# Patient Record
Sex: Male | Born: 1995 | Race: White | Hispanic: No | Marital: Single | Smoking: Never smoker
Health system: Southern US, Community
[De-identification: ages and names within clinical notes are randomized; demographics above are authoritative.]

## PROBLEM LIST (undated history)

## (undated) DIAGNOSIS — G250 Essential tremor: Secondary | ICD-10-CM

## (undated) DIAGNOSIS — D8989 Other specified disorders involving the immune mechanism, not elsewhere classified: Secondary | ICD-10-CM

## (undated) DIAGNOSIS — R011 Cardiac murmur, unspecified: Secondary | ICD-10-CM

## (undated) DIAGNOSIS — B948 Sequelae of other specified infectious and parasitic diseases: Secondary | ICD-10-CM

## (undated) DIAGNOSIS — J302 Other seasonal allergic rhinitis: Secondary | ICD-10-CM

## (undated) DIAGNOSIS — G43909 Migraine, unspecified, not intractable, without status migrainosus: Secondary | ICD-10-CM

## (undated) HISTORY — PX: ULNAR NERVE TRANSPOSITION: SHX2595

## (undated) HISTORY — DX: Other specified disorders involving the immune mechanism, not elsewhere classified: D89.89

## (undated) HISTORY — DX: Cardiac murmur, unspecified: R01.1

## (undated) HISTORY — DX: Sequelae of other specified infectious and parasitic diseases: B94.8

## (undated) HISTORY — DX: Migraine, unspecified, not intractable, without status migrainosus: G43.909

---

## 2018-09-16 ENCOUNTER — Encounter: Payer: Self-pay | Admitting: Emergency Medicine

## 2018-09-16 ENCOUNTER — Emergency Department: Payer: 59

## 2018-09-16 ENCOUNTER — Other Ambulatory Visit: Payer: Self-pay

## 2018-09-16 ENCOUNTER — Observation Stay
Admission: EM | Admit: 2018-09-16 | Discharge: 2018-09-17 | Disposition: A | Discharge status: Home / Self Care | Payer: 59 | Attending: Internal Medicine | Admitting: Internal Medicine

## 2018-09-16 DIAGNOSIS — E860 Dehydration: Secondary | ICD-10-CM | POA: Diagnosis present

## 2018-09-16 DIAGNOSIS — N433 Hydrocele, unspecified: Secondary | ICD-10-CM | POA: Diagnosis not present

## 2018-09-16 DIAGNOSIS — J029 Acute pharyngitis, unspecified: Secondary | ICD-10-CM | POA: Insufficient documentation

## 2018-09-16 DIAGNOSIS — R509 Fever, unspecified: Secondary | ICD-10-CM | POA: Insufficient documentation

## 2018-09-16 DIAGNOSIS — R000 Tachycardia, unspecified: Secondary | ICD-10-CM | POA: Diagnosis not present

## 2018-09-16 DIAGNOSIS — R6510 Systemic inflammatory response syndrome (SIRS) of non-infectious origin without acute organ dysfunction: Secondary | ICD-10-CM

## 2018-09-16 DIAGNOSIS — R112 Nausea with vomiting, unspecified: Secondary | ICD-10-CM

## 2018-09-16 DIAGNOSIS — N50811 Right testicular pain: Secondary | ICD-10-CM

## 2018-09-16 DIAGNOSIS — B349 Viral infection, unspecified: Secondary | ICD-10-CM | POA: Diagnosis not present

## 2018-09-16 DIAGNOSIS — N179 Acute kidney failure, unspecified: Secondary | ICD-10-CM | POA: Diagnosis not present

## 2018-09-16 HISTORY — DX: Other seasonal allergic rhinitis: J30.2

## 2018-09-16 LAB — URINALYSIS, COMPLETE (UACMP) WITH MICROSCOPIC
Bacteria, UA: NONE SEEN
Bilirubin Urine: NEGATIVE
GLUCOSE, UA: NEGATIVE mg/dL
HGB URINE DIPSTICK: NEGATIVE
Ketones, ur: 80 mg/dL — AB
Leukocytes, UA: NEGATIVE
NITRITE: NEGATIVE
Protein, ur: NEGATIVE mg/dL
pH: 5 (ref 5.0–8.0)

## 2018-09-16 LAB — COMPREHENSIVE METABOLIC PANEL
ALK PHOS: 77 U/L (ref 38–126)
ALT: 19 U/L (ref 0–44)
AST: 24 U/L (ref 15–41)
Albumin: 4.9 g/dL (ref 3.5–5.0)
Anion gap: 13 (ref 5–15)
BUN: 14 mg/dL (ref 6–20)
CALCIUM: 9.8 mg/dL (ref 8.9–10.3)
CO2: 23 mmol/L (ref 22–32)
CREATININE: 1.14 mg/dL (ref 0.61–1.24)
Chloride: 103 mmol/L (ref 98–111)
GFR calc non Af Amer: >60 mL/min (ref >60)
Glucose, Bld: 89 mg/dL (ref 70–99)
Potassium: 3.8 mmol/L (ref 3.5–5.1)
Sodium: 139 mmol/L (ref 135–145)
Total Bilirubin: 1.4 mg/dL — ABNORMAL HIGH (ref 0.3–1.2)
Total Protein: 8.4 g/dL — ABNORMAL HIGH (ref 6.5–8.1)

## 2018-09-16 LAB — CBC WITH DIFFERENTIAL/PLATELET
Basophils Absolute: 0.1 10*3/uL (ref 0–0.1)
Basophils Relative: 0 %
EOS PCT: 0 %
Eosinophils Absolute: 0 10*3/uL (ref 0–0.7)
HCT: 47.7 % (ref 40.0–52.0)
HEMOGLOBIN: 16.4 g/dL (ref 13.0–18.0)
LYMPHS PCT: 10 %
Lymphs Abs: 1.4 10*3/uL (ref 1.0–3.6)
MCH: 29.9 pg (ref 26.0–34.0)
MCHC: 34.4 g/dL (ref 32.0–36.0)
MCV: 86.9 fL (ref 80.0–100.0)
MONOS PCT: 10 %
Monocytes Absolute: 1.3 10*3/uL — ABNORMAL HIGH (ref 0.2–1.0)
Neutro Abs: 11.1 10*3/uL — ABNORMAL HIGH (ref 1.4–6.5)
Neutrophils Relative %: 80 %
PLATELETS: 241 10*3/uL (ref 150–440)
RBC: 5.49 MIL/uL (ref 4.40–5.90)
RDW: 13.4 % (ref 11.5–14.5)
WBC: 13.9 10*3/uL — AB (ref 3.8–10.6)

## 2018-09-16 LAB — INFLUENZA PANEL BY PCR (TYPE A & B)
INFLBPCR: NEGATIVE
Influenza A By PCR: NEGATIVE

## 2018-09-16 LAB — HEMOGLOBIN A1C
Hgb A1c MFr Bld: 4.8 % (ref 4.8–5.6)
Mean Plasma Glucose: 91.06 mg/dL

## 2018-09-16 LAB — TSH: TSH: 1.074 u[IU]/mL (ref 0.350–4.500)

## 2018-09-16 LAB — GROUP A STREP BY PCR: Group A Strep by PCR: NOT DETECTED

## 2018-09-16 LAB — LACTIC ACID, PLASMA: Lactic Acid, Venous: 0.8 mmol/L (ref 0.5–1.9)

## 2018-09-16 LAB — LIPASE, BLOOD: Lipase: 24 U/L (ref 11–51)

## 2018-09-16 LAB — MONONUCLEOSIS SCREEN: Mono Screen: NEGATIVE

## 2018-09-16 MED ORDER — DOCUSATE SODIUM 100 MG PO CAPS
100.0000 mg | ORAL_CAPSULE | Freq: Two times a day (BID) | ORAL | Status: DC
  Administered 2018-09-16: 100 mg via ORAL
  Filled 2018-09-16 (×2): qty 1

## 2018-09-16 MED ORDER — SODIUM CHLORIDE 0.9 % IV BOLUS
1000.0000 mL | Freq: Once | INTRAVENOUS | Status: AC
  Administered 2018-09-16: 1000 mL via INTRAVENOUS

## 2018-09-16 MED ORDER — SODIUM CHLORIDE 0.9 % IV SOLN
3.0000 g | Freq: Once | INTRAVENOUS | Status: AC
  Administered 2018-09-16: 3 g via INTRAVENOUS
  Filled 2018-09-16: qty 3

## 2018-09-16 MED ORDER — ENOXAPARIN SODIUM 40 MG/0.4ML ~~LOC~~ SOLN
40.0000 mg | SUBCUTANEOUS | Status: DC
  Administered 2018-09-16: 40 mg via SUBCUTANEOUS
  Filled 2018-09-16: qty 0.4

## 2018-09-16 MED ORDER — ONDANSETRON HCL 4 MG PO TABS: 4.0000 mg | ORAL_TABLET | Freq: Four times a day (QID) | ORAL | Status: DC | PRN

## 2018-09-16 MED ORDER — IOPAMIDOL (ISOVUE-300) INJECTION 61%
30.0000 mL | Freq: Once | INTRAVENOUS | Status: AC | PRN
  Administered 2018-09-16: 30 mL via ORAL

## 2018-09-16 MED ORDER — ONDANSETRON HCL 4 MG/2ML IJ SOLN: 4.0000 mg | Freq: Four times a day (QID) | INTRAMUSCULAR | Status: DC | PRN

## 2018-09-16 MED ORDER — GUAIFENESIN 100 MG/5ML PO SOLN
5.0000 mL | ORAL | Status: DC | PRN
  Administered 2018-09-16: 100 mg via ORAL
  Filled 2018-09-16 (×3): qty 5

## 2018-09-16 MED ORDER — SALINE SPRAY 0.65 % NA SOLN
1.0000 | NASAL | Status: DC | PRN
  Administered 2018-09-16: 1 via NASAL
  Filled 2018-09-16: qty 44

## 2018-09-16 MED ORDER — SODIUM CHLORIDE 0.9 % IV SOLN
INTRAVENOUS | Status: DC
  Administered 2018-09-16 – 2018-09-17 (×4): via INTRAVENOUS

## 2018-09-16 MED ORDER — ACETAMINOPHEN 500 MG PO TABS: 1000.0000 mg | ORAL_TABLET | Freq: Once | ORAL | Status: DC

## 2018-09-16 MED ORDER — IOPAMIDOL (ISOVUE-300) INJECTION 61%
100.0000 mL | Freq: Once | INTRAVENOUS | Status: AC | PRN
  Administered 2018-09-16: 100 mL via INTRAVENOUS

## 2018-09-16 MED ORDER — MORPHINE SULFATE (PF) 2 MG/ML IV SOLN
2.0000 mg | Freq: Once | INTRAVENOUS | Status: AC
  Administered 2018-09-16: 2 mg via INTRAVENOUS
  Filled 2018-09-16: qty 1

## 2018-09-16 MED ORDER — ONDANSETRON HCL 4 MG/2ML IJ SOLN
4.0000 mg | Freq: Once | INTRAMUSCULAR | Status: AC
  Administered 2018-09-16: 4 mg via INTRAVENOUS
  Filled 2018-09-16: qty 2

## 2018-09-16 MED ORDER — KETOROLAC TROMETHAMINE 30 MG/ML IJ SOLN
15.0000 mg | Freq: Once | INTRAMUSCULAR | Status: AC
  Administered 2018-09-16: 15 mg via INTRAVENOUS
  Filled 2018-09-16: qty 1

## 2018-09-16 MED ORDER — ACETAMINOPHEN 650 MG RE SUPP: 650.0000 mg | Freq: Four times a day (QID) | RECTAL | Status: DC | PRN

## 2018-09-16 MED ORDER — ACETAMINOPHEN 325 MG PO TABS
650.0000 mg | ORAL_TABLET | Freq: Four times a day (QID) | ORAL | Status: DC | PRN
  Administered 2018-09-16: 650 mg via ORAL
  Filled 2018-09-16: qty 2

## 2018-09-16 MED ORDER — SODIUM CHLORIDE 0.9 % IV SOLN
3.0000 g | Freq: Four times a day (QID) | INTRAVENOUS | Status: AC
  Administered 2018-09-16 – 2018-09-17 (×4): 3 g via INTRAVENOUS
  Filled 2018-09-16 (×4): qty 3

## 2018-09-16 MED ORDER — PHENOL 1.4 % MT LIQD
1.0000 | OROMUCOSAL | Status: DC | PRN
  Administered 2018-09-16: 1 via OROMUCOSAL
  Filled 2018-09-16: qty 177

## 2018-09-16 MED ORDER — HYDROCODONE-ACETAMINOPHEN 7.5-325 MG/15ML PO SOLN
10.0000 mL | Freq: Once | ORAL | Status: AC
  Administered 2018-09-16: 10 mL via ORAL
  Filled 2018-09-16: qty 15

## 2018-09-16 NOTE — ED Notes (Signed)
This RN to bedside, introduced self to patient/friend at bedside. Medications given per MD order. Explained would be calling report for patient to be admitted ASAP. Pt states understanding at this time.

## 2018-09-16 NOTE — Plan of Care (Addendum)
The patient is being monitored at this time. Low grade fever from admission from ED has been reduced. The patient has been stable. Currently on a telemetry monitor. Stable with a normal sinus rhythm. On droplet precautions at this time.  IV antibiotics provided for 24hrs.  Problem: Education: Goal: Knowledge of General Education information will improve Description Including pain rating scale, medication(s)/side effects and non-pharmacologic comfort measures Outcome: Progressing   Problem: Health Behavior/Discharge Planning: Goal: Ability to manage health-related needs will improve Outcome: Progressing   Problem: Clinical Measurements: Goal: Ability to maintain clinical measurements within normal limits will improve Outcome: Progressing Goal: Will remain free from infection Outcome: Progressing Goal: Diagnostic test results will improve Outcome: Progressing Goal: Respiratory complications will improve Outcome: Progressing Goal: Cardiovascular complication will be avoided Outcome: Progressing   Problem: Activity: Goal: Risk for activity intolerance will decrease Outcome: Progressing   Problem: Nutrition: Goal: Adequate nutrition will be maintained Outcome: Progressing   Problem: Coping: Goal: Level of anxiety will decrease Outcome: Progressing   Problem: Elimination: Goal: Will not experience complications related to bowel motility Outcome: Progressing Goal: Will not experience complications related to urinary retention Outcome: Progressing   Problem: Pain Managment: Goal: General experience of comfort will improve Outcome: Progressing   Problem: Safety: Goal: Ability to remain free from injury will improve Outcome: Progressing   Problem: Skin Integrity: Goal: Risk for impaired skin integrity will decrease Outcome: Progressing

## 2018-09-16 NOTE — ED Notes (Signed)
Admitting MD at bedside at this time.

## 2018-09-16 NOTE — ED Notes (Signed)
Per CD RN, Lorene Dy, Pt to have Respiratory viral panel, Influenza, and strep for rule out as well as buccal swab with synthetic swab for state lab to pick up from [facility] lab. This RN contacted Suzie in lab to confirm all orders and this RN walked specimens to lab and went over each with Suzie.

## 2018-09-16 NOTE — ED Provider Notes (Signed)
[facility] ([place]) Emergency Department Provider Note   ____________________________________________   First MD Initiated Contact with Patient 09/16/18 0315     (approximate)  I have reviewed the triage vital signs and the nursing notes.   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  States he awoke yesterday with sore throat, fever to 103 F, body aches, headache, ear pain.  Presents to the ED because tonight he developed abdominal pain and nausea.  Also has had some mild nonproductive cough.  Probable sick contacts as [place] a college campus.  Denies chest pain, shortness of breath, vomiting, dysuria, diarrhea.  Denies recent travel or trauma.   Past medical history None  There are no active problems to display for this patient.   History reviewed. No pertinent surgical history.  Prior to Admission medications   Not on File    Allergies Patient has no known allergies.  No family history on file.  Social History Social History   Tobacco Use  . Smoking status: Not on file  Substance Use Topics  . Alcohol use: Not on file  . Drug use: Not on file  No recent EtOH  Review of Systems  Constitutional: Positive for fever Eyes: No visual changes. ENT: Positive for sore throat and ear pain. Cardiovascular: Denies chest pain. Respiratory: Positive for nonproductive cough.  Denies shortness of breath. Gastrointestinal: Today for abdominal pain and nausea, no vomiting.  No diarrhea.  No constipation. Genitourinary: Negative for dysuria. Musculoskeletal: Negative for back pain. Skin: Negative for rash. Neurological: Positive for headache.  Negative for focal weakness or numbness.   ____________________________________________   PHYSICAL EXAM:  VITAL SIGNS: ED Triage Vitals  Enc Vitals Group     BP 09/16/18 0253  117/72     Pulse Rate 09/16/18 0253 (!) 132     Resp 09/16/18 0253 20     Temp 09/16/18 0253 99.8 F (37.7 C)     Temp Source 09/16/18 0253 Oral     SpO2 09/16/18 0253 98 %     Weight 09/16/18 0249 153 lb (69.4 kg)     Height 09/16/18 0249 '5\' 10"'  (1.778 m)     Head Circumference --      Peak Flow --      Pain Score 09/16/18 0249 7     Pain Loc --      Pain Edu? --      Excl. in [place]? --     Constitutional: Alert and oriented. Well appearing and in mild acute distress. Eyes: Conjunctivae are normal. PERRL. EOMI. Head: Atraumatic. Nose: No congestion/rhinnorhea. Mouth/Throat: Mucous membranes are moist.  Oropharynx moderately erythematous without tonsillar swelling, exudates or peritonsillar abscess.  Foul-smelling breath.  There is no hoarse or muffled voice.  There is no drooling.  No swollen cheeks or other signs of parotitis. Neck: No stridor.  Supple neck without meningismus. Hematological/Lymphatic/Immunilogical: No cervical lymphadenopathy. Cardiovascular: Tachycardic rate, regular rhythm. Grossly normal heart sounds.  Good peripheral circulation. Respiratory: Normal respiratory effort.  No retractions. Lungs CTAB. Gastrointestinal: Soft and mildly tender to palpation diffusely without rebound or guarding. No distention. No abdominal bruits. No CVA tenderness. Musculoskeletal: No lower extremity tenderness nor edema.  No joint effusions. Neurologic:  Normal speech and language. No gross focal neurologic deficits are appreciated. No gait instability. Skin:  Skin is warm, dry and intact. No rash  noted.  No petechiae. Psychiatric: Mood and affect are normal. Speech and behavior are normal.  ____________________________________________   LABS (all labs ordered are listed, but only abnormal results are displayed)  Labs Reviewed  CBC WITH DIFFERENTIAL/PLATELET - Abnormal; Notable for the following components:      Result Value   WBC 13.9 (*)    Neutro Abs 11.1 (*)    Monocytes  Absolute 1.3 (*)    All other components within normal limits  COMPREHENSIVE METABOLIC PANEL - Abnormal; Notable for the following components:   Total Protein 8.4 (*)    Total Bilirubin 1.4 (*)    All other components within normal limits  GROUP A STREP BY PCR  CULTURE, BLOOD (ROUTINE X 2)  CULTURE, BLOOD (ROUTINE X 2)  RESPIRATORY PANEL BY PCR  LIPASE, BLOOD  MONONUCLEOSIS SCREEN  INFLUENZA PANEL BY PCR (TYPE A & B)  LACTIC ACID, PLASMA  URINALYSIS, COMPLETE (UACMP) WITH MICROSCOPIC  LACTIC ACID, PLASMA  MISC [facility] TEST (SEND OUT)   ____________________________________________  EKG  ED ECG REPORT I, Aditi Rovira J, the attending physician, personally viewed and interpreted this ECG.   Date: 09/16/2018  EKG Time: 0305  Rate: 114  Rhythm: sinus tachycardia  Axis: Normal  Intervals:none  ST&T Change: Nonspecific  ____________________________________________  RADIOLOGY  ED MD interpretation: No acute cardiopulmonary process; no acute intra-abdominal process  Official radiology report(s): Dg Chest 2 View  Result Date: 09/16/2018 CLINICAL DATA:  Cough and fever EXAM: CHEST - 2 VIEW COMPARISON:  None. FINDINGS: The heart size and mediastinal contours are within normal limits. Both lungs are clear. The visualized skeletal structures are unremarkable. IMPRESSION: No active cardiopulmonary disease. Electronically Signed   By: Ulyses Jarred M.D.   On: 09/16/2018 03:47   Ct Abdomen Pelvis W Contrast  Result Date: 09/16/2018 CLINICAL DATA:  Fever, sore throat and abdominal pain. EXAM: CT ABDOMEN AND PELVIS WITH CONTRAST TECHNIQUE: Multidetector CT imaging of the abdomen and pelvis was performed using the standard protocol following bolus administration of intravenous contrast. CONTRAST:  8m ISOVUE-300 IOPAMIDOL (ISOVUE-300) INJECTION 61%, 1025mISOVUE-300 IOPAMIDOL (ISOVUE-300) INJECTION 61% COMPARISON:  None. FINDINGS: LOWER CHEST: There is no basilar pleural or apical pericardial  effusion. HEPATOBILIARY: The hepatic contours and density are normal. There is no intra- or extrahepatic biliary dilatation. The gallbladder is normal. PANCREAS: The pancreatic parenchymal contours are normal and there is no ductal dilatation. There is no peripancreatic fluid collection. SPLEEN: Normal. ADRENALS/URINARY TRACT: --Adrenal glands: Normal. --Right kidney/ureter: No hydronephrosis, nephroureterolithiasis, perinephric stranding or solid renal mass. --Left kidney/ureter: No hydronephrosis, nephroureterolithiasis, perinephric stranding or solid renal mass. --Urinary bladder: Normal for degree of distention STOMACH/BOWEL: --Stomach/Duodenum: There is no hiatal hernia or other gastric abnormality. The duodenal course and caliber are normal. --Small bowel: No dilatation or inflammation. There is a left upper quadrant small bowel small bowel intussusception, which is typically a transient and incidental finding in adults. --Colon: No focal abnormality. --Appendix: Not visualized. No right lower quadrant inflammation or free fluid. VASCULAR/LYMPHATIC: Normal course and caliber of the major abdominal vessels. No abdominal or pelvic lymphadenopathy. REPRODUCTIVE: Normal prostate size with symmetric seminal vesicles. MUSCULOSKELETAL. No bony spinal canal stenosis or focal osseous abnormality. OTHER: None. IMPRESSION: No acute abnormality of the abdomen or pelvis. Electronically Signed   By: KeUlyses Jarred.D.   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  Differential diagnosis includes but is not limited to tonsillitis, pharyngitis, URI, influenza, pneumonia, etc.  Will obtain screening lab work including mono, initiate IV fluid resuscitation.  Administer 2 mg IV morphine paired with 4 mg IV Zofran for pain and nausea.  Clinical Course as of Sep 16 621  Tue Sep 16, 2018  0451 Updated patient of test results thus far.  Still complains of a sore throat.  Will administer Lortab elixir.  We will also repeat 4 mg IV Zofran.  Patient has finished his first bottle of oral contrast.  In the interim, we have called the state infectious disease nurse on-call for detailed instructions on buccal smear collection to test for mumps.  Although patient does not have evidence of parotitis at this time, he does reside and [place] where there has been 6 confirmed cases of mumps in fully vaccinated students.  Patient has had full vaccinations.  Received an email 2 weeks ago from the school offering a MMR booster which he declined.   [JS]  [id number] Updated patient on rest of lab work and CT scan.  Patient continues to be tachycardic, vomited after coming back from CT scan.  Will start IV antibiotics for throat infection.  Off swabs have been obtained and sent.  Will discuss with hospitalist to evaluate patient in the emergency department for admission.   [JS]    Clinical Course User Index [JS] Paulette Blanch, MD     ____________________________________________   FINAL CLINICAL IMPRESSION(S) / ED DIAGNOSES  Final diagnoses:  Fever, unspecified fever cause  Pharyngitis, unspecified etiology  Dehydration  Tachycardia  Non-intractable vomiting with nausea, unspecified vomiting type  SIRS (systemic inflammatory response syndrome) [facility])     ED Discharge Orders    None       Note:  This document was prepared using Dragon voice recognition software and may  include unintentional dictation errors.    Paulette Blanch, MD 09/16/18 [phone]

## 2018-09-16 NOTE — Progress Notes (Signed)
[place] [place] NAME: Jeffery Jenkins    MR#:  [id number]  DATE OF BIRTH:  1996-07-24  SUBJECTIVE:  CHIEF COMPLAINT: Patient is resting comfortably.  Feeling much better.  Abdominal pain improved tolerating diet.  Still febrile Student at [employer]:  CONSTITUTIONAL: Has fever, fatigue or weakness.  EYES: No blurred or double vision.  EARS, NOSE, AND THROAT: No tinnitus or ear pain.  RESPIRATORY: No cough, shortness of breath, wheezing or hemoptysis.  CARDIOVASCULAR: No chest pain, orthopnea, edema.  GASTROINTESTINAL: No nausea, vomiting, diarrhea or abdominal pain.  GENITOURINARY: No dysuria, hematuria.  ENDOCRINE: No polyuria, nocturia,  HEMATOLOGY: No anemia, easy bruising or bleeding SKIN: No rash or lesion. MUSCULOSKELETAL: No joint pain or arthritis.   NEUROLOGIC: No tingling, numbness, weakness.  PSYCHIATRY: No anxiety or depression.   DRUG ALLERGIES:  No Known Allergies  VITALS:  Blood pressure 119/62, pulse 96, temperature (!) 101 F (38.3 C), temperature source Oral, resp. rate 17, height '5\' 10"'  (1.778 m), weight 69.4 kg, SpO2 97 %.  PHYSICAL EXAMINATION:  GENERAL:  22 y.o.-year-old patient lying in the bed with no acute distress.  EYES: Pupils equal, round, reactive to light and accommodation. No scleral icterus. Extraocular muscles intact.  HEENT: Head atraumatic, normocephalic. Oropharynx and nasopharynx clear.  NECK:  Supple, no jugular venous distention. No thyroid enlargement, no tenderness.  LUNGS: Normal breath sounds bilaterally, no wheezing, rales,rhonchi or crepitation. No use of accessory muscles of respiration.  CARDIOVASCULAR: S1, S2 normal. No murmurs, rubs, or gallops.  ABDOMEN: Soft, nontender, nondistended. Bowel sounds present. No organomegaly or mass.  EXTREMITIES: No pedal edema, cyanosis, or clubbing.  NEUROLOGIC: Cranial nerves II through XII are intact. Muscle strength 5/5 in  all extremities. Sensation intact. Gait not checked.  PSYCHIATRIC: The patient is alert and oriented x 3.  SKIN: No obvious rash, lesion, or ulcer.    LABORATORY PANEL:   CBC Recent Labs  Lab 09/16/18 0303  WBC 13.9*  HGB 16.4  HCT 47.7  PLT 241   ------------------------------------------------------------------------------------------------------------------  Chemistries  Recent Labs  Lab 09/16/18 0303  NA 139  K 3.8  CL 103  CO2 23  GLUCOSE 89  BUN 14  CREATININE 1.14  CALCIUM 9.8  AST 24  ALT 19  ALKPHOS 77  BILITOT 1.4*   ------------------------------------------------------------------------------------------------------------------  Cardiac Enzymes No results for input(s): TROPONINI in the last 168 hours. ------------------------------------------------------------------------------------------------------------------  RADIOLOGY:  Dg Chest 2 View  Result Date: 09/16/2018 CLINICAL DATA:  Cough and fever EXAM: CHEST - 2 VIEW COMPARISON:  None. FINDINGS: The heart size and mediastinal contours are within normal limits. Both lungs are clear. The visualized skeletal structures are unremarkable. IMPRESSION: No active cardiopulmonary disease. Electronically Signed   By: Ulyses Jarred M.D.   On: 09/16/2018 03:47   Ct Abdomen Pelvis W Contrast  Result Date: 09/16/2018 CLINICAL DATA:  Fever, sore throat and abdominal pain. EXAM: CT ABDOMEN AND PELVIS WITH CONTRAST TECHNIQUE: Multidetector CT imaging of the abdomen and pelvis was performed using the standard protocol following bolus administration of intravenous contrast. CONTRAST:  35m ISOVUE-300 IOPAMIDOL (ISOVUE-300) INJECTION 61%, 1069mISOVUE-300 IOPAMIDOL (ISOVUE-300) INJECTION 61% COMPARISON:  None. FINDINGS: LOWER CHEST: There is no basilar pleural or apical pericardial effusion. HEPATOBILIARY: The hepatic contours and density are normal. There is no intra- or extrahepatic biliary dilatation. The gallbladder is  normal. PANCREAS: The pancreatic parenchymal contours are normal and there is no ductal dilatation. There is no peripancreatic  fluid collection. SPLEEN: Normal. ADRENALS/URINARY TRACT: --Adrenal glands: Normal. --Right kidney/ureter: No hydronephrosis, nephroureterolithiasis, perinephric stranding or solid renal mass. --Left kidney/ureter: No hydronephrosis, nephroureterolithiasis, perinephric stranding or solid renal mass. --Urinary bladder: Normal for degree of distention STOMACH/BOWEL: --Stomach/Duodenum: There is no hiatal hernia or other gastric abnormality. The duodenal course and caliber are normal. --Small bowel: No dilatation or inflammation. There is a left upper quadrant small bowel small bowel intussusception, which is typically a transient and incidental finding in adults. --Colon: No focal abnormality. --Appendix: Not visualized. No right lower quadrant inflammation or free fluid. VASCULAR/LYMPHATIC: Normal course and caliber of the major abdominal vessels. No abdominal or pelvic lymphadenopathy. REPRODUCTIVE: Normal prostate size with symmetric seminal vesicles. MUSCULOSKELETAL. No bony spinal canal stenosis or focal osseous abnormality. OTHER: None. IMPRESSION: No acute abnormality of the abdomen or pelvis. Electronically Signed   By: Ulyses Jarred M.D.   On: 09/16/2018 05:58    EKG:   Orders placed or performed during the hospital encounter of 09/16/18  . EKG 12-Lead  . EKG 12-Lead    ASSESSMENT AND PLAN:    This is a 22 year old male admitted for dehydration.  1.  Sepsis patient met septic criteria with fever and tachycardia at the time of admission Probably viral etiology Chest x-ray negative, CT abdomen negative Blood cultures are negative so far Rapid strep test negative Respiratory viral panel is pending MMR immunology titers are pending Hydrate with IV fluids and empiric IV antibiotic Unasyn in the interim  2.  AKI 2/2  Dehydration: Related to pharyngitis and  fever. Hydrate with IV fluids and repeat a.m. labs  Encourage p.o. Intake.  3.  Fever: Viral versus bacterial.   Symptomatic treatment, Tylenol as needed   4.  Acute pharyngitis:  Follow-up on the MMR immunology titers strep test negative   5.  Elevated total bili-denies any abdominal pain repeat a.m. labs further work based on the repeat labs   4.  DVT prophylaxis: Lovenox    All the records are reviewed and case discussed with Care Management/Social Workerr. Management plans discussed with the patient, family and they are in agreement.  CODE STATUS: fc  TOTAL TIME TAKING CARE OF THIS PATIENT: 39 minutes.   POSSIBLE D/C IN 1-2DAYS, DEPENDING ON CLINICAL CONDITION.  Note: This dictation was prepared with Dragon dictation along with smaller phrase technology. Any transcriptional errors that result from this process are unintentional.   Nicholes Mango M.D on 09/16/2018 at 11:09 PM  Between 7am to 6pm - Pager - [phone] After 6pm go to [email or web address] - password [email or web address] [facility]  Office  [phone]  CC: Primary care physician; System, Pcp Not In

## 2018-09-16 NOTE — ED Triage Notes (Signed)
Pt to triage via w/c with no distress noted; pt reports fever, sore throat, HA, and abd pain since this am; tylenol taken at 11pm

## 2018-09-16 NOTE — Consult Note (Signed)
Pharmacy Antibiotic Note  Jeffery Jenkins is a 22 y.o. male admitted on 09/16/2018 with a throat infection.  Pharmacy has been consulted for Unasyn dosing. He presents with fever, sore throat, cough, abdominal pain. Dr Amado Coe has asked for Unasyn to be administered for 24 hours  Plan: Unasyn 3 grams IV every 6 hours for 24 hours  Height: 5\' 10"  (177.8 cm) Weight: 153 lb (69.4 kg) IBW/kg (Calculated) : 73  Temp (24hrs), Avg:99.8 F (37.7 C), Min:98.7 F (37.1 C), Max:100.9 F (38.3 C)  Recent Labs  Lab 09/16/18 0303 09/16/18 0423  WBC 13.9*  --   CREATININE 1.14  --   LATICACIDVEN  --  0.8    Estimated Creatinine Clearance: 101.5 mL/min (by C-G formula based on SCr of 1.14 mg/dL).    No Known Allergies  Antimicrobials this admission: Unasyn 10/8 >>   Microbiology results: 10/8 BCx: pending  Thank you for allowing pharmacy to be a part of this patient's care.  Lowella Bandy, PharmD 09/16/2018 5:02 PM

## 2018-09-16 NOTE — H&P (Signed)
Jeffery Jenkins is an 22 y.o. male.   Chief Complaint: Abdominal pain HPI: The patient with no chronic medical problems presents to the emergency department complaining of abdominal pain and headache.  The patient reports that both symptoms have been severe today but began with a mild sore throat which has now increased in severity as well.  He admits to one episode of nonbloody nonbilious emesis prior to coming to the hospital.  The patient had a few more episodes of vomiting with his oral contrast and once with throat swab.  He took Tylenol immediately prior to arrival in the emergency department but reports fevers at home as high as 104 F.  After multiple throat cultures as well as routine laboratory evaluation the emergency department staff called the hospitalist service for further management.  Past Medical History:  Diagnosis Date  . Seasonal allergies     Past Surgical History:  Procedure Laterality Date  . ULNAR NERVE TRANSPOSITION      History reviewed. No pertinent family history. No family history of DM/HTN/CAD  Social History:  has no tobacco, alcohol, and drug history on file.  Allergies: No Known Allergies  Prior to Admission medications   Not on File     Results for orders placed or performed during the hospital encounter of 09/16/18 (from the past 48 hour(s))  Group A Strep by PCR     Status: None   Collection Time: 09/16/18  3:03 AM  Result Value Ref Range   Group A Strep by PCR NOT DETECTED NOT DETECTED    Comment: Performed at [facility], [address] [place], [place] [address]  CBC with Differential     Status: Abnormal   Collection Time: 09/16/18  3:03 AM  Result Value Ref Range   WBC 13.9 (H) 3.8 - 10.6 K/uL   RBC 5.49 4.40 - 5.90 MIL/uL   Hemoglobin 16.4 13.0 - 18.0 g/dL   HCT 47.7 40.0 - 52.0 %   MCV 86.9 80.0 - 100.0 fL   MCH 29.9 26.0 - 34.0 pg   MCHC 34.4 32.0 - 36.0 g/dL   RDW 13.4 11.5 - 14.5 %   Platelets 241 150 - 440 K/uL    Neutrophils Relative % 80 %   Neutro Abs 11.1 (H) 1.4 - 6.5 K/uL   Lymphocytes Relative 10 %   Lymphs Abs 1.4 1.0 - 3.6 K/uL   Monocytes Relative 10 %   Monocytes Absolute 1.3 (H) 0.2 - 1.0 K/uL   Eosinophils Relative 0 %   Eosinophils Absolute 0.0 0 - 0.7 K/uL   Basophils Relative 0 %   Basophils Absolute 0.1 0 - 0.1 K/uL    Comment: Performed at [facility], [address] [place], [place] [address]  Comprehensive metabolic panel     Status: Abnormal   Collection Time: 09/16/18  3:03 AM  Result Value Ref Range   Sodium 139 135 - 145 mmol/L   Potassium 3.8 3.5 - 5.1 mmol/L   Chloride 103 98 - 111 mmol/L   CO2 23 22 - 32 mmol/L   Glucose, Bld 89 70 - 99 mg/dL   BUN 14 6 - 20 mg/dL   Creatinine, Ser 1.14 0.61 - 1.24 mg/dL   Calcium 9.8 8.9 - 10.3 mg/dL   Total Protein 8.4 (H) 6.5 - 8.1 g/dL   Albumin 4.9 3.5 - 5.0 g/dL   AST 24 15 - 41 U/L   ALT 19 0 - 44 U/L   Alkaline Phosphatase 77  38 - 126 U/L   Total Bilirubin 1.4 (H) 0.3 - 1.2 mg/dL   GFR calc non Af Amer >60 >60 mL/min   GFR calc Af Amer >60 >60 mL/min    Comment: (NOTE) The eGFR has been calculated using the CKD EPI equation. This calculation has not been validated in all clinical situations. eGFR's persistently <60 mL/min signify possible Chronic Kidney Disease.    Anion gap 13 5 - 15    Comment: Performed at [facility], [address] [place], [place] [address]  Urinalysis, Complete w Microscopic     Status: Abnormal   Collection Time: 09/16/18  3:03 AM  Result Value Ref Range   Color, Urine YELLOW (A) YELLOW   APPearance CLEAR (A) CLEAR   Specific Gravity, Urine >1.046 (H) 1.005 - 1.030   pH 5.0 5.0 - 8.0   Glucose, UA NEGATIVE NEGATIVE mg/dL   Hgb urine dipstick NEGATIVE NEGATIVE   Bilirubin Urine NEGATIVE NEGATIVE   Ketones, ur 80 (A) NEGATIVE mg/dL   Protein, ur NEGATIVE NEGATIVE mg/dL   Nitrite NEGATIVE NEGATIVE   Leukocytes, UA NEGATIVE NEGATIVE   RBC / HPF 0-5 0 - 5 RBC/hpf    WBC, UA 0-5 0 - 5 WBC/hpf   Bacteria, UA NONE SEEN NONE SEEN   Squamous Epithelial / LPF 0-5 0 - 5   Mucus PRESENT     Comment: Performed at [facility], [address] [place], [place] [address]  Lipase, blood     Status: None   Collection Time: 09/16/18  3:03 AM  Result Value Ref Range   Lipase 24 11 - 51 U/L    Comment: Performed at [facility], [address] [place], [place] [address]  Mononucleosis screen     Status: None   Collection Time: 09/16/18  3:29 AM  Result Value Ref Range   Mono Screen NEGATIVE NEGATIVE    Comment: Performed at [facility], [address] [place], [place] [address]  Influenza panel by PCR (type A & B)     Status: None   Collection Time: 09/16/18  4:20 AM  Result Value Ref Range   Influenza A By PCR NEGATIVE NEGATIVE   Influenza B By PCR NEGATIVE NEGATIVE    Comment: (NOTE) The Xpert Xpress Flu assay is intended as an aid in the diagnosis of  influenza and should not be used as a sole basis for treatment.  This  assay is FDA approved for nasopharyngeal swab specimens only. Nasal  washings and aspirates are unacceptable for Xpert Xpress Flu testing. Performed at [facility], [address] [place], [place] [address]   Lactic acid, plasma     Status: None   Collection Time: 09/16/18  4:23 AM  Result Value Ref Range   Lactic Acid, Venous 0.8 0.5 - 1.9 mmol/L    Comment: Performed at  Hospital Lab, [address] [place],  [address]   Dg Chest 2 View  Result Date: 09/16/2018 CLINICAL DATA:  Cough and fever EXAM: CHEST - 2 VIEW COMPARISON:  None. FINDINGS: The heart size and mediastinal contours are within normal limits. Both lungs are clear. The visualized skeletal structures are unremarkable. IMPRESSION: No active cardiopulmonary disease. Electronically Signed   By: Kevin  Herman M.D.   On: 09/16/2018 03:47   Ct Abdomen Pelvis W Contrast  Result Date: 09/16/2018 CLINICAL DATA:   Fever, sore throat and abdominal pain. EXAM: CT ABDOMEN AND PELVIS WITH CONTRAST TECHNIQUE: Multidetector CT imaging of   the abdomen and pelvis was performed using the standard protocol following bolus administration of intravenous contrast. CONTRAST:  30mL ISOVUE-300 IOPAMIDOL (ISOVUE-300) INJECTION 61%, 100mL ISOVUE-300 IOPAMIDOL (ISOVUE-300) INJECTION 61% COMPARISON:  None. FINDINGS: LOWER CHEST: There is no basilar pleural or apical pericardial effusion. HEPATOBILIARY: The hepatic contours and density are normal. There is no intra- or extrahepatic biliary dilatation. The gallbladder is normal. PANCREAS: The pancreatic parenchymal contours are normal and there is no ductal dilatation. There is no peripancreatic fluid collection. SPLEEN: Normal. ADRENALS/URINARY TRACT: --Adrenal glands: Normal. --Right kidney/ureter: No hydronephrosis, nephroureterolithiasis, perinephric stranding or solid renal mass. --Left kidney/ureter: No hydronephrosis, nephroureterolithiasis, perinephric stranding or solid renal mass. --Urinary bladder: Normal for degree of distention STOMACH/BOWEL: --Stomach/Duodenum: There is no hiatal hernia or other gastric abnormality. The duodenal course and caliber are normal. --Small bowel: No dilatation or inflammation. There is a left upper quadrant small bowel small bowel intussusception, which is typically a transient and incidental finding in adults. --Colon: No focal abnormality. --Appendix: Not visualized. No right lower quadrant inflammation or free fluid. VASCULAR/LYMPHATIC: Normal course and caliber of the major abdominal vessels. No abdominal or pelvic lymphadenopathy. REPRODUCTIVE: Normal prostate size with symmetric seminal vesicles. MUSCULOSKELETAL. No bony spinal canal stenosis or focal osseous abnormality. OTHER: None. IMPRESSION: No acute abnormality of the abdomen or pelvis. Electronically Signed   By: Kevin  Herman M.D.   On: 09/16/2018 05:58    ROS  Blood pressure 117/87,  pulse (!) 118, temperature (!) 100.5 F (38.1 C), temperature source Oral, resp. rate 20, height 5' 10" (1.778 m), weight 69.4 kg, SpO2 100 %. Physical Exam   Assessment/Plan This is a 22-year-old male admitted for dehydration. 1.  Dehydration: Related to pharyngitis and fever.  Treat with intravenous fluid.  Encourage p.o. intake. 2.  Fever: Viral versus bacterial.  Throat cultures pending.  Continue Unasyn for now. 3.  Pharyngitis: Differential includes strep versus mumps 4.  DVT prophylaxis: Lovenox 5.  GI prophylaxis: None The patient is a full code.  Time spent on admission orders and patient care approximately 45 minutes  Diamond,  Nichlas S, MD 09/16/2018, 7:32 AM   

## 2018-09-16 NOTE — ED Notes (Signed)
Attempted to call report x 1  

## 2018-09-17 ENCOUNTER — Observation Stay: Payer: 59

## 2018-09-17 LAB — COMPREHENSIVE METABOLIC PANEL
ALT: 13 U/L (ref 0–44)
AST: 16 U/L (ref 15–41)
Albumin: 3.5 g/dL (ref 3.5–5.0)
Alkaline Phosphatase: 56 U/L (ref 38–126)
Anion gap: 8 (ref 5–15)
BUN: 9 mg/dL (ref 6–20)
CO2: 23 mmol/L (ref 22–32)
Calcium: 8.4 mg/dL — ABNORMAL LOW (ref 8.9–10.3)
Chloride: 109 mmol/L (ref 98–111)
Creatinine, Ser: 0.88 mg/dL (ref 0.61–1.24)
GFR calc Af Amer: >60 mL/min (ref >60)
GFR calc non Af Amer: >60 mL/min (ref >60)
Glucose, Bld: 80 mg/dL (ref 70–99)
Potassium: 4 mmol/L (ref 3.5–5.1)
Sodium: 140 mmol/L (ref 135–145)
Total Bilirubin: 1.1 mg/dL (ref 0.3–1.2)
Total Protein: 6.2 g/dL — ABNORMAL LOW (ref 6.5–8.1)

## 2018-09-17 LAB — CBC WITH DIFFERENTIAL/PLATELET
Abs Immature Granulocytes: 0.03 10*3/uL (ref 0.00–0.07)
BASOS ABS: 0 10*3/uL (ref 0.0–0.1)
Basophils Relative: 0 %
Eosinophils Absolute: 0 10*3/uL (ref 0.0–0.5)
Eosinophils Relative: 0 %
HCT: 37.5 % — ABNORMAL LOW (ref 39.0–52.0)
HEMOGLOBIN: 12.8 g/dL — AB (ref 13.0–17.0)
IMMATURE GRANULOCYTES: 0 %
LYMPHS ABS: 1.9 10*3/uL (ref 0.7–4.0)
LYMPHS PCT: 19 %
MCH: 29.5 pg (ref 26.0–34.0)
MCHC: 34.1 g/dL (ref 30.0–36.0)
MCV: 86.4 fL (ref 80.0–100.0)
Monocytes Absolute: 1.3 10*3/uL — ABNORMAL HIGH (ref 0.1–1.0)
Monocytes Relative: 13 %
NEUTROS PCT: 68 %
NRBC: 0 % (ref 0.0–0.2)
Neutro Abs: 6.8 10*3/uL (ref 1.7–7.7)
Platelets: 192 10*3/uL (ref 150–400)
RBC: 4.34 MIL/uL (ref 4.22–5.81)
RDW: 12.5 % (ref 11.5–15.5)
WBC: 10.2 10*3/uL (ref 4.0–10.5)

## 2018-09-17 LAB — RESPIRATORY PANEL BY PCR
Adenovirus: NOT DETECTED
BORDETELLA PERTUSSIS-RVPCR: NOT DETECTED
CHLAMYDOPHILA PNEUMONIAE-RVPPCR: NOT DETECTED
CORONAVIRUS 229E-RVPPCR: NOT DETECTED
CORONAVIRUS HKU1-RVPPCR: NOT DETECTED
Coronavirus NL63: NOT DETECTED
Coronavirus OC43: NOT DETECTED
Influenza A: NOT DETECTED
Influenza B: NOT DETECTED
Metapneumovirus: NOT DETECTED
Mycoplasma pneumoniae: NOT DETECTED
PARAINFLUENZA VIRUS 3-RVPPCR: NOT DETECTED
Parainfluenza Virus 1: NOT DETECTED
Parainfluenza Virus 2: NOT DETECTED
Parainfluenza Virus 4: NOT DETECTED
RHINOVIRUS / ENTEROVIRUS - RVPPCR: NOT DETECTED
Respiratory Syncytial Virus: NOT DETECTED

## 2018-09-17 LAB — MEASLES/MUMPS/RUBELLA IMMUNITY
Mumps IgG: 53.5 AU/mL (ref >10.9)
Rubella: <0.9 index — ABNORMAL LOW (ref >0.99)
Rubeola IgG: <13.5 AU/mL — ABNORMAL LOW (ref >16.4)

## 2018-09-17 MED ORDER — SODIUM CHLORIDE 0.9 % IV SOLN
INTRAVENOUS | Status: DC | PRN
  Administered 2018-09-17: 1000 mL via INTRAVENOUS

## 2018-09-17 NOTE — Progress Notes (Signed)
ID PROGRESS NOTE  MUMPS PCR Testing is negative per state lab/[place] [facility]. If patient is not having any parotitis, this is unlikely to be mumps. If questions, please contact IP on call (see amion)  Jeffery Jenkins B. Drue Second MD MPH [facility] [phone]

## 2018-09-17 NOTE — Progress Notes (Addendum)
[facility] [place] at [facility] was admitted to the Hospital on 09/16/2018 and Discharged  09/17/2018 and should be excused from work/school  for 2 days starting 09/16/2018 , may return to work/school without any restrictions from 09/18/2018  Call Ihor Austin MD with questions.  Ihor Austin M.D on 09/17/2018,at 4:15 PM  [facility] [place] at [facility]  [phone]

## 2018-09-17 NOTE — Discharge Summary (Signed)
[facility] - [place] at [facility]   PATIENT NAME: Jeffery Jenkins    MR#:  [id number]  DATE OF BIRTH:  05-14-1996  DATE OF ADMISSION:  09/16/2018 ADMITTING PHYSICIAN: Ramonita Lab, MD  DATE OF DISCHARGE: 09/17/2018  PRIMARY CARE PHYSICIAN: System, Pcp Not In   ADMISSION DIAGNOSIS:  Dehydration [E86.0] Tachycardia [R00.0] SIRS (systemic inflammatory response syndrome) (HCC) [R65.10] Fever, unspecified fever cause [R50.9] Pharyngitis, unspecified etiology [J02.9] Non-intractable vomiting with nausea, unspecified vomiting type [R11.2]  DISCHARGE DIAGNOSIS:  Sepsis ruled out   Acute viral infection Dehydration Nausea and vomiting Fever of unknown cause  SECONDARY DIAGNOSIS:   Past Medical History:  Diagnosis Date  . Seasonal allergies      ADMITTING HISTORY The patient with no chronic medical problems presents to the emergency department complaining of abdominal pain and headache.  The patient reports that both symptoms have been severe today but began with a mild sore throat which has now increased in severity as well.  He admits to one episode of nonbloody nonbilious emesis prior to coming to the hospital.  The patient had a few more episodes of vomiting with his oral contrast and once with throat swab.  He took Tylenol immediately prior to arrival in the emergency department but reports fevers at home as high as 104 F.  After multiple throat cultures as well as routine laboratory evaluation the emergency department staff called the hospitalist service for further management.   HOSPITAL COURSE:  Patient was admitted to medical floor.  Was put on airborne precautions and contact precautions.  Started on broad-spectrum IV antibiotics.  Blood culture did not reveal any growth.  Respiratory panel was negative.  Mump's titers were normal.  Patient also complained of some scrotal pain for which he had a scrotal ultrasound which showed tiny hydrocele.  This was  discussed with infectious disease Dr. who recommended no further intervention.  Patient's fever resolved.  His renal functions improved and he was well hydrated with IV fluids.  Dehydration also improved.  Group A streptococcus PCR was also negative.  Urinalysis did not show any infection.  CONSULTS OBTAINED:    DRUG ALLERGIES:  No Known Allergies  DISCHARGE MEDICATIONS:   Allergies as of 09/17/2018   No Known Allergies     Medication List    You have not been prescribed any medications.     Today  Patient seen today No fever No shortness of breath Tolerating diet well  VITAL SIGNS:  Blood pressure 126/73, pulse (!) 57, temperature 98.5 F (36.9 C), temperature source Oral, resp. rate 12, height 5\' 10"  (1.778 m), weight 69.4 kg, SpO2 99 %.  I/O:    Intake/Output Summary (Last 24 hours) at 09/17/2018 1616 Last data filed at 09/17/2018 0933 Gross per 24 hour  Intake 2992.81 ml  Output 450 ml  Net 2542.81 ml    PHYSICAL EXAMINATION:  Physical Exam  GENERAL:  22 y.o.-year-old patient lying in the bed with no acute distress.  LUNGS: Normal breath sounds bilaterally, no wheezing, rales,rhonchi or crepitation. No use of accessory muscles of respiration.  CARDIOVASCULAR: S1, S2 normal. No murmurs, rubs, or gallops.  ABDOMEN: Soft, non-tender, non-distended. Bowel sounds present. No organomegaly or mass.  NEUROLOGIC: Moves all 4 extremities. PSYCHIATRIC: The patient is alert and oriented x 3.  SKIN: No obvious rash, lesion, or ulcer.   DATA REVIEW:   CBC Recent Labs  Lab 09/17/18 0409  WBC 10.2  HGB 12.8*  HCT 37.5*  PLT 192  Chemistries  Recent Labs  Lab 09/17/18 0409  NA 140  K 4.0  CL 109  CO2 23  GLUCOSE 80  BUN 9  CREATININE 0.88  CALCIUM 8.4*  AST 16  ALT 13  ALKPHOS 56  BILITOT 1.1    Cardiac Enzymes No results for input(s): TROPONINI in the last 168 hours.  Microbiology Results  Results for orders placed or performed during the  hospital encounter of 09/16/18  Group A Strep by PCR     Status: None   Collection Time: 09/16/18  3:03 AM  Result Value Ref Range Status   Group A Strep by PCR NOT DETECTED NOT DETECTED Final    Comment: Performed at [facility], [address] [place], [place] [address]  Blood culture (routine x 2)     Status: None (Preliminary result)   Collection Time: 09/16/18  4:23 AM  Result Value Ref Range Status   Specimen Description BLOOD LEFT FA  Final   Special Requests   Final    BOTTLES DRAWN AEROBIC AND ANAEROBIC Blood Culture adequate volume   Culture   Final    NO GROWTH 1 DAY Performed at  [facility], [address], [place], [place] [address]    Report Status PENDING  Incomplete  Blood culture (routine x 2)     Status: None (Preliminary result)   Collection Time: 09/16/18  4:23 AM  Result Value Ref Range Status   Specimen Description BLOOD RIGHT [facility]  Final   Special Requests   Final    BOTTLES DRAWN AEROBIC AND ANAEROBIC Blood Culture results may not be optimal due to an excessive volume of blood received in culture bottles   Culture   Final    NO GROWTH 1 DAY Performed at [facility], [address] [place], [place] [address]    Report Status PENDING  Incomplete  Respiratory Panel by PCR     Status: None   Collection Time: 09/16/18  5:00 AM  Result Value Ref Range Status   Adenovirus NOT DETECTED NOT DETECTED Final   Coronavirus 229E NOT DETECTED NOT DETECTED Final   Coronavirus HKU1 NOT DETECTED NOT DETECTED Final   Coronavirus NL63 NOT DETECTED NOT DETECTED Final   Coronavirus OC43 NOT DETECTED NOT DETECTED Final   Metapneumovirus NOT DETECTED NOT DETECTED Final   Rhinovirus / Enterovirus NOT DETECTED NOT DETECTED Final   Influenza A NOT DETECTED NOT DETECTED Final   Influenza B NOT DETECTED NOT DETECTED Final   Parainfluenza Virus 1 NOT DETECTED NOT DETECTED Final   Parainfluenza Virus 2 NOT DETECTED NOT DETECTED Final   Parainfluenza  Virus 3 NOT DETECTED NOT DETECTED Final   Parainfluenza Virus 4 NOT DETECTED NOT DETECTED Final   Respiratory Syncytial Virus NOT DETECTED NOT DETECTED Final   Bordetella pertussis NOT DETECTED NOT DETECTED Final   Chlamydophila pneumoniae NOT DETECTED NOT DETECTED Final   Mycoplasma pneumoniae NOT DETECTED NOT DETECTED Final    Comment: Performed at [facility], [address], [place], [place] [address]    RADIOLOGY:  Dg Chest 2 View  Result Date: 09/16/2018 CLINICAL DATA:  Cough and fever EXAM: CHEST - 2 VIEW COMPARISON:  None. FINDINGS: The heart size and mediastinal contours are within normal limits. Both lungs are clear. The visualized skeletal structures are unremarkable. IMPRESSION: No active cardiopulmonary disease. Electronically Signed   By: Deatra Robinson M.D.   On: 09/16/2018 03:47   Ct Abdomen Pelvis W Contrast  Result Date: 09/16/2018  CLINICAL DATA:  Fever, sore throat and abdominal pain. EXAM: CT ABDOMEN AND PELVIS WITH CONTRAST TECHNIQUE: Multidetector CT imaging of the abdomen and pelvis was performed using the standard protocol following bolus administration of intravenous contrast. CONTRAST:  30mL ISOVUE-300 IOPAMIDOL (ISOVUE-300) INJECTION 61%, ISOVUE-300 IOPAMIDOL (ISOVUE-300) INJECTION 61% COMPARISON:  None. FINDINGS: LOWER CHEST: There is no basilar pleural or apical pericardial effusion. HEPATOBILIARY: The hepatic contours and density are normal. There is no intra- or extrahepatic biliary dilatation. The gallbladder is normal. PANCREAS: The pancreatic parenchymal contours are normal and there is no ductal dilatation. There is no peripancreatic fluid collection. SPLEEN: Normal. ADRENALS/URINARY TRACT: --Adrenal glands: Normal. --Right kidney/ureter: No hydronephrosis, nephroureterolithiasis, perinephric stranding or solid renal mass. --Left kidney/ureter: No hydronephrosis, nephroureterolithiasis, perinephric stranding or solid renal mass. --Urinary bladder: Normal  for degree of distention STOMACH/BOWEL: --Stomach/Duodenum: There is no hiatal hernia or other gastric abnormality. The duodenal course and caliber are normal. --Small bowel: No dilatation or inflammation. There is a left upper quadrant small bowel small bowel intussusception, which is typically a transient and incidental finding in adults. --Colon: No focal abnormality. --Appendix: Not visualized. No right lower quadrant inflammation or free fluid. VASCULAR/LYMPHATIC: Normal course and caliber of the major abdominal vessels. No abdominal or pelvic lymphadenopathy. REPRODUCTIVE: Normal prostate size with symmetric seminal vesicles. MUSCULOSKELETAL. No bony spinal canal stenosis or focal osseous abnormality. OTHER: None. IMPRESSION: No acute abnormality of the abdomen or pelvis. Electronically Signed   By: Deatra Robinson M.D.   On: 09/16/2018 05:58   US Scrotum W/doppler  Result Date: 09/17/2018 CLINICAL DATA:  Right-sided scrotal pain. EXAM: SCROTAL ULTRASOUND DOPPLER ULTRASOUND OF THE TESTICLES TECHNIQUE: Complete ultrasound examination of the testicles, epididymis, and other scrotal structures was performed. Color and spectral Doppler ultrasound were also utilized to evaluate blood flow to the testicles. COMPARISON:  None. FINDINGS: Right testicle Measurements: 4.6 x 2.1 x 3.1 cm. No mass or microlithiasis visualized. Left testicle Measurements: 4.6 x 1.9 x 2.8 cm. No mass or microlithiasis visualized. Right epididymis:  Normal in size and appearance. Left epididymis:  Normal in size and appearance. Hydrocele:  There is a tiny right hydrocele. Varicocele:  None visualized. Pulsed Doppler interrogation of both testes demonstrates normal low resistance arterial and venous waveforms bilaterally. No inguinal hernia is were observed. IMPRESSION: No testicular or epididymal masses. No evidence of orchitis, epididymitis, or torsion. Tiny right sided hydrocele. No evidence of inguinal hernias. Electronically Signed    By: David  [place] M.D.   On: 09/17/2018 12:23    Follow up with PCP in 1 week.  Management plans discussed with the patient, family and they are in agreement.  CODE STATUS: Full code    Code Status Orders  (From admission, onward)         Start     Ordered   09/16/18 0837  Full code  Continuous     09/16/18 0836        Code Status History    This patient has a current code status but no historical code status.      TOTAL TIME TAKING CARE OF THIS PATIENT ON DAY OF DISCHARGE: more than 33 minutes.   Ihor Austin M.D on 09/17/2018 at 4:16 PM  Between 7am to 6pm - Pager - [phone]  After 6pm go to [email or web address] - password [email or web address] [facility]  [facility] [place] [facility]  Office  [phone]  CC: Primary care physician; System, Pcp Not In  Note: This dictation was prepared with Dragon dictation along with smaller  Company secretary. Any transcriptional errors that result from this process are unintentional.

## 2018-09-21 LAB — CULTURE, BLOOD (ROUTINE X 2)
CULTURE: NO GROWTH
Culture: NO GROWTH
Special Requests: ADEQUATE

## 2018-09-29 ENCOUNTER — Encounter: Payer: Self-pay | Admitting: Emergency Medicine

## 2018-09-29 ENCOUNTER — Emergency Department: Payer: 59

## 2018-09-29 ENCOUNTER — Emergency Department
Admission: EM | Admit: 2018-09-29 | Discharge: 2018-09-29 | Disposition: A | Discharge status: Home / Self Care | Payer: 59 | Attending: Emergency Medicine | Admitting: Emergency Medicine

## 2018-09-29 DIAGNOSIS — R079 Chest pain, unspecified: Secondary | ICD-10-CM | POA: Diagnosis present

## 2018-09-29 DIAGNOSIS — R200 Anesthesia of skin: Secondary | ICD-10-CM | POA: Diagnosis not present

## 2018-09-29 DIAGNOSIS — R0602 Shortness of breath: Secondary | ICD-10-CM | POA: Diagnosis not present

## 2018-09-29 DIAGNOSIS — F419 Anxiety disorder, unspecified: Secondary | ICD-10-CM | POA: Insufficient documentation

## 2018-09-29 LAB — CBC
HEMATOCRIT: 43.9 % (ref 39.0–52.0)
HEMOGLOBIN: 15.1 g/dL (ref 13.0–17.0)
MCH: 29.8 pg (ref 26.0–34.0)
MCHC: 34.4 g/dL (ref 30.0–36.0)
MCV: 86.6 fL (ref 80.0–100.0)
Platelets: 342 10*3/uL (ref 150–400)
RBC: 5.07 MIL/uL (ref 4.22–5.81)
RDW: 12.5 % (ref 11.5–15.5)
WBC: 8.8 10*3/uL (ref 4.0–10.5)
nRBC: 0 % (ref 0.0–0.2)

## 2018-09-29 LAB — BASIC METABOLIC PANEL
ANION GAP: 9 (ref 5–15)
BUN: 13 mg/dL (ref 6–20)
CHLORIDE: 105 mmol/L (ref 98–111)
CO2: 27 mmol/L (ref 22–32)
Calcium: 9.6 mg/dL (ref 8.9–10.3)
Creatinine, Ser: 0.98 mg/dL (ref 0.61–1.24)
GFR calc Af Amer: >60 mL/min (ref >60)
GFR calc non Af Amer: >60 mL/min (ref >60)
GLUCOSE: 91 mg/dL (ref 70–99)
Potassium: 3.7 mmol/L (ref 3.5–5.1)
Sodium: 141 mmol/L (ref 135–145)

## 2018-09-29 LAB — TROPONIN I: Troponin I: <0.03 ng/mL (ref <0.03)

## 2018-09-29 NOTE — ED Triage Notes (Signed)
Pt c/o SOB and chest tightness x1 day, worsening this evening. Pt seen at [facility] clinic and was referred to ED. Pt denies fever, N/V. Pt reports SOB worse with exertion.

## 2018-09-29 NOTE — ED Provider Notes (Addendum)
[facility] Emergency Department Provider Note  ___________________________________________   First MD Initiated Contact with Patient 09/29/18 1931     (approximate)  I have reviewed the triage vital signs and the nursing notes.   HISTORY  Chief Complaint Shortness of Breath and Chest Pain  HPI Jeffery Jenkins is a 22 y.o. male with a recent diagnosis of nausea and vomiting and pharyngitis who is presenting to the hospital with chest pain.  He says that over the past 12 hours he has experienced pressure-like chest pain to the center of his chest which sometimes radiates to the right side of his chest.  Denies any radiation of the back to the arms.  However, he does state he is having shortness of breath with exertion with worsening chest pain with exertion as well.  Says that when he exerts himself he feels a sensation of numbness that overcomes his entire body.  Says that he also had a flight to [place], approximately 1 hour, over the past weekend.  No history of blood clots in his family.  Denies smoking or drug use and says that he drinks occasionally.  Denies fever.  Denies runny nose at this time.  Says that he is recently tested negative for mono x2.  Says the pain is been constant but is mild at this time.   Past Medical History:  Diagnosis Date  . Seasonal allergies     Patient Active Problem List   Diagnosis Date Noted  . Dehydration 09/16/2018    Past Surgical History:  Procedure Laterality Date  . ULNAR NERVE TRANSPOSITION      Prior to Admission medications   Not on File    Allergies Patient has no known allergies.  History reviewed. No pertinent family history.  Social History Social History   Tobacco Use  . Smoking status: Never Smoker  . Smokeless tobacco: Never Used  Substance Use Topics  . Alcohol use: Not Currently  . Drug use: Never    Review of Systems  Constitutional: No fever/chills Eyes: No visual  changes. ENT: No sore throat. Cardiovascular: As above Respiratory: As above Gastrointestinal: No abdominal pain.  No nausea, no vomiting.  No diarrhea.  No constipation. Genitourinary: Negative for dysuria. Musculoskeletal: Negative for back pain. Skin: Negative for rash. Neurological: Negative for headaches, focal weakness    ____________________________________________   PHYSICAL EXAM:  VITAL SIGNS: ED Triage Vitals  Enc Vitals Group     BP 09/29/18 1904 119/66     Pulse Rate 09/29/18 1904 64     Resp 09/29/18 1904 18     Temp 09/29/18 1904 98.2 F (36.8 C)     Temp Source 09/29/18 1904 Oral     SpO2 09/29/18 1904 100 %     Weight --      Height --      Head Circumference --      Peak Flow --      Pain Score 09/29/18 1929 4     Pain Loc --      Pain Edu? --      Excl. in GC? --     Constitutional: Alert and oriented. Well appearing and in no acute distress. Eyes: Conjunctivae are normal.  Head: Atraumatic. Nose: No congestion/rhinnorhea. Mouth/Throat: Mucous membranes are moist.  Neck: No stridor.   Cardiovascular: Normal rate, regular rhythm. Grossly normal heart sounds.  Good peripheral circulation with equal and bilateral radial as well as dorsalis pedis pulses.  Chest pain no  reproducible to palpation. Respiratory: Normal respiratory effort.  No retractions. Lungs CTAB. Gastrointestinal: Soft and nontender. No distention.  Musculoskeletal: No lower extremity tenderness nor edema.  No joint effusions. Neurologic:  Normal speech and language. No gross focal neurologic deficits are appreciated. Skin:  Skin is warm, dry and intact. No rash noted. Psychiatric: Mood and affect are normal. Speech and behavior are normal.  ____________________________________________   LABS (all labs ordered are listed, but only abnormal results are displayed)  Labs Reviewed  BASIC METABOLIC PANEL  CBC  TROPONIN I   ____________________________________________  EKG  ED  ECG REPORT I, Arelia Longest, the attending physician, personally viewed and interpreted this ECG.   Date: 09/29/2018  EKG Time: 1901  Rate: 72  Rhythm: normal sinus rhythm  Axis: Normal  Intervals:none  ST&T Change: No ST segment elevation or depression.  Biphasic T waves in aVF. No significant change from previous. ____________________________________________  RADIOLOGY  No acute finding on the chest x-ray.  Bedside subxiphoid view of the heart obtained without any pericardial effusion. ____________________________________________   PROCEDURES  Procedure(s) performed:   Procedures  Critical Care performed:   ____________________________________________   INITIAL IMPRESSION / ASSESSMENT AND PLAN / ED COURSE  Pertinent labs & imaging results that were available during my care of the patient were reviewed by me and considered in my medical decision making (see chart for details).  Differential diagnosis includes, but is not limited to, ACS, aortic dissection, pulmonary embolism, cardiac tamponade, pneumothorax, pneumonia, pericarditis, myocarditis, GI-related causes including esophagitis/gastritis, and musculoskeletal chest wall pain.   As part of my medical decision making, I reviewed the following data within the electronic MEDICAL RECORD NUMBER Notes from prior ED visits  Patient at this time without any distress.  Unclear cause of his chest pain.  However, he has had very reassuring lab work.  No pericardial friction rub.  No signs of pericarditis on the EKG.  No pericardial effusion.  Troponin negative.  Unlikely to be myocarditis.  Also with a negative chest x-ray.  Recommend Tylenol as well as ibuprofen.  Also recommend follow-up with cardiology given recent constellation of symptoms over the past several weeks.  Patient understand the diagnosis as well as treatment and willing to comply.  PERC negative.  ____________________________________________   FINAL CLINICAL  IMPRESSION(S) / ED DIAGNOSES  Chest pain.  NEW MEDICATIONS STARTED DURING THIS VISIT:  New Prescriptions   No medications on file     Note:  This document was prepared using Dragon voice recognition software and may include unintentional dictation errors.     Myrna Blazer, MD 09/29/18 2035    Myrna Blazer, MD 09/29/18 2039

## 2018-09-29 NOTE — ED Notes (Signed)
AAOx3.  Skin warm and dry. Appears less anxious. NAD.  D/C home

## 2018-10-03 ENCOUNTER — Encounter: Payer: Self-pay | Admitting: Internal Medicine

## 2018-10-03 ENCOUNTER — Ambulatory Visit: Payer: 59 | Admitting: Internal Medicine

## 2018-10-03 ENCOUNTER — Ambulatory Visit (INDEPENDENT_AMBULATORY_CARE_PROVIDER_SITE_OTHER): Payer: 59

## 2018-10-03 ENCOUNTER — Other Ambulatory Visit: Payer: Self-pay

## 2018-10-03 VITALS — BP 118/60 | HR 54 | Ht 70.0 in | Wt 151.0 lb

## 2018-10-03 DIAGNOSIS — R079 Chest pain, unspecified: Secondary | ICD-10-CM

## 2018-10-03 DIAGNOSIS — R0602 Shortness of breath: Secondary | ICD-10-CM | POA: Diagnosis not present

## 2018-10-03 DIAGNOSIS — R0789 Other chest pain: Secondary | ICD-10-CM | POA: Insufficient documentation

## 2018-10-03 LAB — ECHOCARDIOGRAM COMPLETE
HEIGHTINCHES: 70 in
WEIGHTICAEL: 2416 [oz_av]

## 2018-10-03 MED ORDER — NAPROXEN 500 MG PO TABS: 500.0000 mg | ORAL_TABLET | Freq: Two times a day (BID) | ORAL | 1 refills | Status: DC

## 2018-10-03 NOTE — Patient Instructions (Signed)
Medication Instructions:  Your physician has recommended you make the following change in your medication:  1- TAKE Naproxen 500 mg by mouth two times a day.  If you need a refill on your cardiac medications before your next appointment, please call your pharmacy.   Lab work: none If you have labs (blood work) drawn today and your tests are completely normal, you will receive your results only by: Marland Kitchen MyChart Message (if you have MyChart) OR . A paper copy in the mail If you have any lab test that is abnormal or we need to change your treatment, we will call you to review the results.  Testing/Procedures: Your physician has requested that you have an echocardiogram. Echocardiography is a painless test that uses sound waves to create images of your heart. It provides your doctor with information about the size and shape of your heart and how well your heart's chambers and valves are working. This procedure takes approximately one hour. There are no restrictions for this procedure. You may get an IV, if needed, to receive an ultrasound enhancing agent through to better visualize your heart.     Follow-Up: At [facility], you and your health needs are our priority.  As part of our continuing mission to provide you with exceptional heart care, we have created designated Provider Care Teams.  These Care Teams include your primary Cardiologist (physician) and Advanced Practice Providers (APPs -  Physician Assistants and Nurse Practitioners) who all work together to provide you with the care you need, when you need it. You will need a follow up appointment in 1 weeks.   You may see DR Cristal Deer or one of the following Advanced Practice Providers on your designated Care Team:   Nicolasa Ducking, NP Eula Listen, PA-C . Marisue Ivan, PA-C

## 2018-10-03 NOTE — Progress Notes (Signed)
New Outpatient Visit Date: 10/03/2018  Referring Provider: Myrna Blazer, MD [address] [place], [place] [address]  Chief Complaint: Chest pain  HPI:  Jeffery Jenkins is a 22 y.o. male who is being seen today for the evaluation of chest pain at the request of Dr. Pershing Proud. 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  He presented to the emergency department 4 days ago with 12 hours of chest pain in the center of his chest radiating to the right.  He noted some exertional dyspnea as well.  This had been preceded by a hospitalization for fever and hypotension earlier in the month (09/16/2018).  Since then, Jeffery Jenkins has continued to have frequent episodes of chest pain.  He now describes a tightness along the left side of his sternum that is worsened with activity.  It is sometimes also present at rest.  It is not affected by position.  He notes considerable fatigue and exertional dyspnea.  He also has experienced intermittent numbness face and hands.  He denies orthopnea and PND as well as edema.  He has lost about 12 pounds over the last month, though he has started gaining some of this back.  He notes that he has not been eating as much as usual.  He noted lightheadedness at the time of his hospitalization earlier this month.  He has not passed out.  Aside from the 2 ED visits this month, he has been evaluated by at least 2 different providers near his home in [place].  His mother, who accompanies Jeffery Jenkins, has been concerned that his symptoms reflect mononucleosis.  Multiple labs are currently pending.  He is a Holiday representative at [employer], Teacher, adult education.  He denies history of cardiac disease.  He underwent cardiac consultation as a teenager for clearance before receiving a migraine medication.  Echocardiogram at that time was normal.  He has a history of heart  disease in his grandparents.  There is no family history of sudden cardiac death.  --------------------------------------------------------------------------------------------------  Cardiovascular History & Procedures: Cardiovascular Problems:  Chest pain  Risk Factors:  None  Cath/PCI:  None  CV Surgery:  None  EP Procedures and Devices:  None  Non-Invasive Evaluation(s):  None  Recent CV Pertinent Labs: Lab Results  Component Value Date   K 3.7 09/29/2018   BUN 13 09/29/2018   CREATININE 0.98 09/29/2018    --------------------------------------------------------------------------------------------------  Past Medical History:  Diagnosis Date  . Heart murmur   . Migraine   . 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 Seasonal allergies     Past Surgical History:  Procedure Laterality Date  . ULNAR NERVE TRANSPOSITION      Medications: None  Allergies: Patient has no known allergies.  Social History   Tobacco Use  . Smoking status: Never Smoker  . Smokeless tobacco: Never Used  Substance Use Topics  . Alcohol use: Not Currently  . Drug use: Never    Family History  Problem Relation Age of Onset  . Heart disease Maternal Grandfather   . Heart attack Paternal Grandfather 40    Review of Systems: A 12-system review of systems was performed and was negative except as noted in the HPI.  --------------------------------------------------------------------------------------------------  Physical Exam: BP 118/60 (BP Location: Right Arm, Patient Position: Sitting, Cuff Size: Normal)   Pulse (!) 54   Ht 5\' 10"  (1.778 m)   Wt  151 lb (68.5 kg)   BMI 21.67 kg/m   General: Thin man, seated comfortably in the exam room.  He is accompanied by his mother. HEENT: No conjunctival pallor or scleral icterus. Moist mucous membranes. OP clear. Neck: Supple without lymphadenopathy,  thyromegaly, JVD, or HJR. No carotid bruit. Lungs: Normal work of breathing. Clear to auscultation bilaterally without wheezes or crackles. Heart: Bradycardic but regular without murmurs, rubs, or gallops. Non-displaced PMI. Abd: Bowel sounds present. Soft, NT/ND without hepatosplenomegaly Ext: No lower extremity edema. Radial, PT, and DP pulses are 2+ bilaterally Skin: Warm and dry without rash. Neuro: CNIII-XII intact. Strength and fine-touch sensation intact in upper and lower extremities bilaterally. Psych: Normal mood and affect.  EKG: Sinus bradycardia (heart rate 54 bpm).  Otherwise, no abnormalities.  Lab Results  Component Value Date   WBC 8.8 09/29/2018   HGB 15.1 09/29/2018   HCT 43.9 09/29/2018   MCV 86.6 09/29/2018   PLT 342 09/29/2018    Lab Results  Component Value Date   NA 141 09/29/2018   K 3.7 09/29/2018   CL 105 09/29/2018   CO2 27 09/29/2018   BUN 13 09/29/2018   CREATININE 0.98 09/29/2018   GLUCOSE 91 09/29/2018   ALT 13 09/17/2018    No results found for: CHOL, HDL, LDLCALC, LDLDIRECT, TRIG, CHOLHDL  --------------------------------------------------------------------------------------------------  ASSESSMENT AND PLAN: Chest pain and shortness of breath Pain has been present for almost 1 week and was preceded by a febrile illness earlier in the month.  Quality of the pain is atypical, though it is somewhat related to activity.  It is not positional nor worsened with deep inspiration, though Jeffery Jenkins reports exertional dyspnea.  I am most concerned that his chest pain is related to an inflammatory process stemming from his recent febrile illness.  Physical exam and EKG today are normal without friction rub or ST segment elevation.  He has no risk factors for atherosclerotic coronary artery disease.  Additionally, ED work-up, including opponent, was negative earlier this week.  His chest pain began after he returned from a trip to [place], though he  was only on board the airplane for an hour and a half.  He does not have any other risk factors for pulmonary embolism.  I worry that a d-dimer may be elevated given his recent infectious illness.  Chest radiograph 4 days ago was normal.  I have recommended that we obtain a transthoracic echocardiogram to evaluate for pericardial effusion or cardiomyopathy that could indicate pericarditis and/or myocarditis.  I have recommended that Jeffery Jenkins begin taking naproxen 500 mg twice daily.  I will have him return next week for reassessment.  If he continues to have chest pain despite unrevealing echocardiogram, CTA of the chest (ideally gated) may be necessary to exclude anomalous coronary artery as well as other intrathoracic abnormalities to account for his chest pain.  I will defer ordering labs today, as multiple studies ordered by providers in [place] are currently pending.  Follow-up: Return to clinic in 1 week.  Yvonne Kendall, MD 10/03/2018 8:07 PM

## 2018-10-06 ENCOUNTER — Ambulatory Visit
Admission: RE | Admit: 2018-10-06 | Discharge: 2018-10-06 | Disposition: A | Discharge status: Home / Self Care | Payer: 59 | Source: Ambulatory Visit | Attending: Family Medicine | Admitting: Family Medicine

## 2018-10-06 ENCOUNTER — Other Ambulatory Visit: Payer: Self-pay | Admitting: Family Medicine

## 2018-10-06 DIAGNOSIS — R079 Chest pain, unspecified: Secondary | ICD-10-CM | POA: Diagnosis not present

## 2018-10-06 MED ORDER — IOPAMIDOL (ISOVUE-370) INJECTION 76%
75.0000 mL | Freq: Once | INTRAVENOUS | Status: AC | PRN
  Administered 2018-10-06: 75 mL via INTRAVENOUS

## 2018-10-08 NOTE — Progress Notes (Signed)
Cardiology Office Note Date:  10/09/2018  Patient ID:  Jeffery Jenkins, DOB 07/07/1996, MRN [id number] PCP:  System, Pcp Not In  Cardiologist:  Dr. Saunders Revel, MD    Chief Complaint: Follow up  History of Present Illness: Jeffery Jenkins is a 22 y.o. 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  Patient was admitted to [facility] from 10/8 through 10/9 for abdominal pain, headache, scrotal pain sore throat with associated isolated episode of emesis.  He reported a T-max of 9 F at home prior to his presentation to the hospital on 10/8.  He was treated with broad-spectrum IV antibiotics.  Blood cultures were negative x2.  Respiratory panel was negative.  MMR titers were noted to demonstrate immunization to mumps however his rubella and rubeola IgG titers were not indicative of adequate immunization.  Scrotal ultrasound showed a tiny hydrocele.  CT abdomen pelvis with contrast showed no acute abnormality.  Case was discussed with infectious disease who recommended no further intervention.  GS PCR was negative.  UA was negative for infection.  Mono screen negative.  Influenza negative.  A1c 4.8, TSH normal, lipase 20 4T bili 1.4, LFT normal, WBC 13.9 trending to 8.8, hemoglobin 15.1, platelet count 342.  EKG upon admission on 10/8 showed sinus tachycardia, 114 bpm, borderline right axis deviation, early repolarization, nonspecific inferior ST-T changes.  He was discharged with acute viral infection and dehydration.  He returned to [facility] ED on 10/21 with a 12-hour history of chest pressure and shortness of breath.  Work-up in the ED showed normal chest x-ray, troponin negative x1, EKG showed NSR, 72 bpm, right axis deviation, nonspecific lateral ST-T changes, T wave inversion along the inferior leads.  Outpatient  follow-up with cardiology was advised.  Patient was initially evaluated by Dr. Saunders Revel on 10/03/2018 at the request of Dr. Clearnce Hasten from the [facility] ED for chest pain.  Patient patient describes continued frequent episodes of chest pain that were noted to be tightness along the left side of the sternum that was worse with activity and sometimes present at rest.  It was not affected by position.  He noted considerable fatigue and DOE.  There was some intermittent numbness of the face and hands.  He reported approximate 12 pound weight loss over the month prior though had started gaining some of this weight back when he was seen by cardiology on 10/27.  Appetite was decreased.  It was noted at the time of his cardiology visit on 10/25 aside from his visits to [facility] as above he had been evaluated by at least 2 different providers near his home in [place].  When patient was evaluated by cardiology on 10/25 his mother accompanied him who flew down from [place] and she was concerned his symptoms could possibly reflect mononucleosis.  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  There was also noted that he underwent prior cardiac consult as a teenager for clearance prior to receiving a migraine medication with echocardiogram reportedly normal at that time.  There is no family history of sudden cardiac death.  EKG at cardiology visit shows sinus bradycardia, 54 bpm, no acute abnormalities.  He was advised to take NSAIDs for possible pericarditis/myocarditis.  Echocardiogram was done at that time and showed an EF of 55 to 60%, normal wall motion, grade 1 diastolic dysfunction, normal  size left atrium, RV systolic function normal, PASP normal.  He followed up with a new PCP at [facility] clinic on 10/28 with continued chest discomfort occurring both at rest and with exertion with associated shortness of breath.  Patient reported at that time he has been evaluated for tickborne illness as well with work-up being  unrevealing.  CTA chest ordered by PCP on 10/28 showed no evidence of acute PE.  Patient comes in today noting he feels worse than when he was last evaluated.  He continues to note right sided chest pressure with associated significant fatigue, exertional dyspnea, numbness in the bilateral hands and perioral region.  He now notes generalized malaise and fatigue as well as bilateral lower extremity cramping.  He denies any further nausea, vomiting, or fevers.  He now has a dry cough.  Weight loss has stabilized and he has 1 pounds up from his last office visit the week prior.  He continues to note a poor p.o. appetite.  He is trying to supplement with water and milk.  States he does not like to drink anything else.  He denies any family history of premature CAD or sudden death.  He denies any tobacco abuse or illegal drugs.  He reports he rarely drinks alcohol.  Patient previously played sports throughout his childhood and in high school and denies any complications with this.  He never reported any dizziness, presyncope, syncope, or chest pain when playing sports.  We reviewed labs that were ordered by his physicians in [place] today which included Lyme titer, sed rate, TSH, CBC, ferritin, B12, folate, and SPEP/UPEP.  He reports all of these labs were unremarkable.  I do not have them for review at this time.  Patient denies any swelling or soreness along the parotid glands.  Of note, and his initial H&P he was reported to have scrotal pain however upon questioning the patient about this today he denies this and stated he had some swelling along the scrotum which was found to be a hydrocele.  He states the pain was more in the lower abdominal region, and has since resolved.  Past Medical History:  Diagnosis Date  . Heart murmur   . Migraine   . 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 Seasonal allergies     Past Surgical History:    Procedure Laterality Date  . ULNAR NERVE TRANSPOSITION      Current Meds  Medication Sig  . naproxen (NAPROSYN) 500 MG tablet Take 1 tablet (500 mg total) by mouth 2 (two) times daily with a meal.  . pantoprazole (PROTONIX) 40 MG tablet Take by mouth.    Allergies:   Other   Social History:  The patient  reports that he has never smoked. He has never used smokeless tobacco. He reports that he drank alcohol. He reports that he does not use drugs.   Family History:  The patient's family history includes Heart attack (age of onset: 77) in his paternal grandfather; Heart disease in his maternal grandfather.  ROS:   Review of Systems  Constitutional: Positive for malaise/fatigue. Negative for chills, diaphoresis, fever and weight loss.  HENT: Negative for congestion.   Eyes: Negative for discharge and redness.  Respiratory: Positive for shortness of breath. Negative for cough, hemoptysis, sputum production and wheezing.   Cardiovascular: Positive for chest pain. Negative for palpitations, orthopnea, claudication, leg swelling and PND.  Gastrointestinal: Negative for abdominal pain, blood in stool, heartburn, melena, nausea  and vomiting.  Genitourinary: Negative for hematuria.  Musculoskeletal: Positive for joint pain and myalgias. Negative for falls.  Skin: Negative for rash.  Neurological: Positive for weakness. Negative for dizziness, tingling, tremors, sensory change, speech change, focal weakness and loss of consciousness.  Endo/Heme/Allergies: Does not bruise/bleed easily.  Psychiatric/Behavioral: Negative for substance abuse. The patient is not nervous/anxious.   All other systems reviewed and are negative.    PHYSICAL EXAM:  VS:  BP 100/62 (BP Location: Left Arm, Patient Position: Sitting, Cuff Size: Normal)   Pulse (!) 58   Ht _0  (1.778 m)   Wt 152 lb (68.9 kg)   BMI 21.81 kg/m  BMI: Body mass index is 21.81 kg/m.  Physical Exam  Constitutional: He is oriented to  person, place, and time. He appears well-developed and well-nourished.  HENT:  Head: Normocephalic and atraumatic.  Eyes: Right eye exhibits no discharge. Left eye exhibits no discharge.  Neck: Normal range of motion. No JVD present.  Cardiovascular: Regular rhythm, S1 normal, S2 normal and normal heart sounds. Bradycardia present. Exam reveals no distant heart sounds, no friction rub, no midsystolic click and no opening snap.  No murmur heard. Pulses:      Posterior tibial pulses are 2+ on the right side, and 2+ on the left side.  Pulmonary/Chest: Effort normal and breath sounds normal. No respiratory distress. He has no decreased breath sounds. He has no wheezes. He has no rales. He exhibits no tenderness.  Abdominal: Soft. He exhibits no distension. There is no tenderness.  Musculoskeletal: He exhibits no edema.  Neurological: He is alert and oriented to person, place, and time.  Skin: Skin is warm and dry. No cyanosis. Nails show no clubbing.  Psychiatric: He has a normal mood and affect. His speech is normal and behavior is normal. Judgment and thought content normal.     EKG:  Was ordered and interpreted by me today. Shows sinus bradycardia, 58 bpm, no acute st/t changes   Recent Labs: 09/16/2018: TSH 1.074 09/17/2018: ALT 13 09/29/2018: BUN 13; Creatinine, Ser 0.98; Hemoglobin 15.1; Platelets 342; Potassium 3.7; Sodium 141  No results found for requested labs within last 8760 hours.   Estimated Creatinine Clearance: 117.2 mL/min (by C-G formula based on SCr of 0.98 mg/dL).   Wt Readings from Last 3 Encounters:  10/09/18 152 lb (68.9 kg)  10/03/18 151 lb (68.5 kg)  09/16/18 153 lb (69.4 kg)     Other studies reviewed: Additional studies/records reviewed today include: summarized above  ASSESSMENT AND PLAN:  1. Chest pain with shortness of breath and generalized fatigue: Currently chest pain-free.  We will schedule the patient for a cardiac CT to exclude anomalous coronary  artery.  He remains on naproxen 500 mg twice daily for empiric treatment of pericarditis/myocarditis.  He is currently taking Protonix 40 mg daily for possible GERD without improvement in symptoms.  2. Recent fever of unknown origin with constellation of nonspecific symptoms as noted above: Extensive work-up thus far including imaging and laboratory analysis has been unrevealing for specific etiology.  Of note, labs checked in the hospital were MMR titers and did not check for active mumps infection.  Patient was noted to be not immune to rubella and rubeola.  He did have a positive mumps titer however upwards of 5% of patients and have received adequate doses of MMR are reported to have developed mumps later on.  Because of this, we will check an IgM mumps level.  Initially I was planning on  checking EBV titers as well as CMV titers however it appears that these were checked by his doctors in [place] with the patient reporting these to be normal.  We will check a CRP and sed rate given his lower extremity cramping.  I have encouraged him to hydrate with water and Gatorade.  He will need to follow-up with his PCP regarding his multiple nonspecific noncardiac complaints.  Disposition: F/u with Dr. Saunders Revel 2 to 3 months.  Current medicines are reviewed at length with the patient today.  The patient did not have any concerns regarding medicines.  Signed, Christell Faith, PA-C 10/09/2018 2:57 PM     [facility] [address] [place] [address] [place] [place], [place] [address] [phone]

## 2018-10-09 ENCOUNTER — Encounter: Payer: Self-pay | Admitting: Physician Assistant

## 2018-10-09 ENCOUNTER — Ambulatory Visit: Payer: 59 | Admitting: Physician Assistant

## 2018-10-09 ENCOUNTER — Other Ambulatory Visit
Admission: RE | Admit: 2018-10-09 | Discharge: 2018-10-09 | Disposition: A | Discharge status: Home / Self Care | Payer: 59 | Source: Ambulatory Visit | Attending: Physician Assistant | Admitting: Physician Assistant

## 2018-10-09 VITALS — BP 100/62 | HR 58 | Ht 70.0 in | Wt 152.0 lb

## 2018-10-09 DIAGNOSIS — R509 Fever, unspecified: Secondary | ICD-10-CM

## 2018-10-09 DIAGNOSIS — M791 Myalgia, unspecified site: Secondary | ICD-10-CM

## 2018-10-09 DIAGNOSIS — R531 Weakness: Secondary | ICD-10-CM

## 2018-10-09 DIAGNOSIS — R072 Precordial pain: Secondary | ICD-10-CM | POA: Insufficient documentation

## 2018-10-09 DIAGNOSIS — R0602 Shortness of breath: Secondary | ICD-10-CM | POA: Diagnosis present

## 2018-10-09 LAB — SEDIMENTATION RATE: SED RATE: 4 mm/h (ref 0–15)

## 2018-10-09 LAB — COMPREHENSIVE METABOLIC PANEL
ALBUMIN: 4.5 g/dL (ref 3.5–5.0)
ALT: 23 U/L (ref 0–44)
AST: 21 U/L (ref 15–41)
Alkaline Phosphatase: 61 U/L (ref 38–126)
Anion gap: 7 (ref 5–15)
BUN: 16 mg/dL (ref 6–20)
CHLORIDE: 106 mmol/L (ref 98–111)
CO2: 28 mmol/L (ref 22–32)
Calcium: 9.6 mg/dL (ref 8.9–10.3)
Creatinine, Ser: 1 mg/dL (ref 0.61–1.24)
GFR calc Af Amer: >60 mL/min (ref >60)
GLUCOSE: 86 mg/dL (ref 70–99)
POTASSIUM: 4.1 mmol/L (ref 3.5–5.1)
SODIUM: 141 mmol/L (ref 135–145)
Total Bilirubin: 1 mg/dL (ref 0.3–1.2)
Total Protein: 7.8 g/dL (ref 6.5–8.1)

## 2018-10-09 LAB — MAGNESIUM: MAGNESIUM: 2 mg/dL (ref 1.7–2.4)

## 2018-10-09 NOTE — Patient Instructions (Addendum)
Medication Instructions:  No changes  If you need a refill on your cardiac medications before your next appointment, please call your pharmacy.   Lab work: Your provider would like for you to have the following labs today: Sed rate, CMET, Magnesium, Mumps IGM. Please have these drawn at the [facility] medical mall.  If you have labs (blood work) drawn today and your tests are completely normal, you will receive your results only by: Marland Kitchen MyChart Message (if you have MyChart) OR . A paper copy in the mail If you have any lab test that is abnormal or we need to change your treatment, we will call you to review the results.  Testing/Procedures: Your physician has requested that you have cardiac CT. Cardiac computed tomography (CT) is a painless test that uses an x-ray machine to take clear, detailed pictures of your heart. For further information please visit [email or web address] Please follow instruction sheet as given.  Follow-Up: At [facility], you and your health needs are our priority.  As part of our continuing mission to provide you with exceptional heart care, we have created designated Provider Care Teams.  These Care Teams include your primary Cardiologist (physician) and Advanced Practice Providers (APPs -  Physician Assistants and Nurse Practitioners) who all work together to provide you with the care you need, when you need it. You will need a follow up appointment in 1 months. You may see Dr. Okey Dupre or one of the following Advanced Practice Providers on your designated Care Team:   Nicolasa Ducking, NP Eula Listen, PA-C . Marisue Ivan, PA-C  Any Other Special Instructions Will Be Listed Below (If Applicable). Please arrive at the [facility] main entrance of [facility] at xx:xx AM (30-45 minutes prior to test start time)  [facility] [address] [place], [place] [address] [phone]  Proceed to the [facility] Radiology Department (First  Floor).  Please follow these instructions carefully (unless otherwise directed):  On the Night Before the Test: . Be sure to Drink plenty of water. . Do not consume any caffeinated/decaffeinated beverages or chocolate 12 hours prior to your test. . Do not take any antihistamines 12 hours prior to your test.  On the Day of the Test: . Drink plenty of water. Do not drink any water within one hour of the test. . Do not eat any food 4 hours prior to the test. . You may take your regular medications prior to the test.       After the Test: . Drink plenty of water. . After receiving IV contrast, you may experience a mild flushed feeling. This is normal. . On occasion, you may experience a mild rash up to 24 hours after the test. This is not dangerous. If this occurs, you can take Benadryl 25 mg and increase your fluid intake. . If you experience trouble breathing, this can be serious. If it is severe call 911 IMMEDIATELY. If it is mild, please call our office. . If you take any of these medications: Glipizide/Metformin, Avandament, Glucavance, please do not take 48 hours after completing test.

## 2018-10-10 ENCOUNTER — Telehealth: Payer: Self-pay | Admitting: Internal Medicine

## 2018-10-10 LAB — MUMPS ANTIBODY, IGM

## 2018-10-10 NOTE — Telephone Encounter (Signed)
Follow up ° °Patient is returning call for lab results. °

## 2018-10-10 NOTE — Telephone Encounter (Signed)
Called patient, advised of labs. Patient verbalized understanding.

## 2018-10-10 NOTE — Telephone Encounter (Signed)
New message:  Patient returning call back concerning result.

## 2018-10-17 ENCOUNTER — Other Ambulatory Visit: Payer: Self-pay | Admitting: Neurology

## 2018-10-17 DIAGNOSIS — R29898 Other symptoms and signs involving the musculoskeletal system: Secondary | ICD-10-CM

## 2018-10-17 DIAGNOSIS — R5382 Chronic fatigue, unspecified: Secondary | ICD-10-CM

## 2018-10-20 ENCOUNTER — Other Ambulatory Visit: Payer: Self-pay | Admitting: Neurology

## 2018-10-20 DIAGNOSIS — R29898 Other symptoms and signs involving the musculoskeletal system: Secondary | ICD-10-CM

## 2018-10-23 ENCOUNTER — Telehealth: Payer: Self-pay

## 2018-10-23 NOTE — Telephone Encounter (Signed)
Dr. Drue SecondSnider reviewed/signed Final lab report. Document ready to scan into epic

## 2018-10-25 ENCOUNTER — Ambulatory Visit
Admission: RE | Admit: 2018-10-25 | Discharge: 2018-10-25 | Disposition: A | Discharge status: Home / Self Care | Payer: 59 | Source: Ambulatory Visit | Attending: Neurology | Admitting: Neurology

## 2018-10-25 DIAGNOSIS — R29898 Other symptoms and signs involving the musculoskeletal system: Secondary | ICD-10-CM

## 2018-10-25 MED ORDER — GADOBUTROL 1 MMOL/ML IV SOLN
6.5000 mL | Freq: Once | INTRAVENOUS | Status: AC | PRN
  Administered 2018-10-25: 6.5 mL via INTRAVENOUS

## 2018-10-27 ENCOUNTER — Ambulatory Visit
Admission: RE | Admit: 2018-10-27 | Discharge: 2018-10-27 | Disposition: A | Discharge status: Home / Self Care | Payer: 59 | Source: Ambulatory Visit | Attending: Neurology | Admitting: Neurology

## 2018-10-27 DIAGNOSIS — R29898 Other symptoms and signs involving the musculoskeletal system: Secondary | ICD-10-CM | POA: Insufficient documentation

## 2018-10-27 DIAGNOSIS — R5382 Chronic fatigue, unspecified: Secondary | ICD-10-CM | POA: Diagnosis present

## 2018-10-27 HISTORY — DX: Essential tremor: G25.0

## 2018-10-27 LAB — CSF CELL COUNT WITH DIFFERENTIAL
RBC Count, CSF: 0 /mm3 (ref 0–3)
Tube #: 3
WBC, CSF: 0 /mm3 (ref 0–5)

## 2018-10-27 LAB — PROTEIN AND GLUCOSE, CSF
GLUCOSE CSF: 63 mg/dL (ref 40–70)
Total  Protein, CSF: 36 mg/dL (ref 15–45)

## 2018-10-27 LAB — GLUCOSE, RANDOM: Glucose, Bld: 102 mg/dL — ABNORMAL HIGH (ref 70–99)

## 2018-10-27 MED ORDER — ACETAMINOPHEN 500 MG PO TABS
1000.0000 mg | ORAL_TABLET | Freq: Four times a day (QID) | ORAL | Status: DC | PRN
  Filled 2018-10-27: qty 2

## 2018-10-27 NOTE — Discharge Instructions (Signed)
Lumbar Puncture, Care After °Refer to this sheet in the next few weeks. These instructions provide you with information on caring for yourself after your procedure. Your health care provider may also give you more specific instructions. Your treatment has been planned according to current medical practices, but problems sometimes occur. Call your health care provider if you have any problems or questions after your procedure. °What can I expect after the procedure? °After your procedure, it is typical to have the following sensations: °· Mild discomfort or pain at the insertion site. °· Mild headache that is relieved with pain medicines. ° °Follow these instructions at home: ° °· Avoid lifting anything heavier than 10 lb (4.5 kg) for at least 12 hours after the procedure. °· Drink enough fluids to keep your urine clear or pale yellow. °Contact a health care provider if: °· You have fever or chills. °· You have nausea or vomiting. °· You have a headache that lasts for more than 2 days. °Get help right away if: °· You have any numbness or tingling in your legs. °· You are unable to control your bowel or bladder. °· You have bleeding or swelling in your back at the insertion site. °· You are dizzy or faint. °This information is not intended to replace advice given to you by your health care provider. Make sure you discuss any questions you have with your health care provider. °Document Released: 12/01/2013 Document Revised: 05/03/2016 Document Reviewed: 08/04/2013 °Elsevier Interactive Patient Education © 2017 Elsevier Inc. ° °

## 2018-10-28 LAB — IGG CSF INDEX
ALBUMIN CSF-MCNC: 26 mg/dL (ref 11–48)
ALBUMIN: 4.2 g/dL (ref 3.5–5.5)
CSF IgG Index: 0.4 (ref 0.0–0.7)
IGG CSF: 3.2 mg/dL (ref 0.0–8.6)
IgG (Immunoglobin G), Serum: 1289 mg/dL (ref 700–1600)
IgG/Alb Ratio, CSF: 0.12 (ref 0.00–0.25)

## 2018-10-29 LAB — OLIGOCLONAL BANDS, CSF + SERM

## 2018-11-04 ENCOUNTER — Ambulatory Visit: Payer: 59

## 2018-11-12 ENCOUNTER — Encounter: Payer: Self-pay | Admitting: Internal Medicine

## 2018-11-12 ENCOUNTER — Ambulatory Visit: Payer: 59 | Admitting: Internal Medicine

## 2018-11-12 VITALS — BP 122/62 | HR 63 | Ht 70.0 in | Wt 152.0 lb

## 2018-11-12 DIAGNOSIS — R0789 Other chest pain: Secondary | ICD-10-CM | POA: Diagnosis not present

## 2018-11-12 DIAGNOSIS — R0602 Shortness of breath: Secondary | ICD-10-CM

## 2018-11-12 NOTE — Patient Instructions (Signed)
Medication Instructions:  Your physician recommends that you continue on your current medications as directed. Please refer to the Current Medication list given to you today.  If you need a refill on your cardiac medications before your next appointment, please call your pharmacy.   Lab work: none If you have labs (blood work) drawn today and your tests are completely normal, you will receive your results only by: Marland Kitchen. MyChart Message (if you have MyChart) OR . A paper copy in the mail If you have any lab test that is abnormal or we need to change your treatment, we will call you to review the results.  Testing/Procedures: none  Follow-Up: At [facility], you and your health needs are our priority.  As part of our continuing mission to provide you with exceptional heart care, we have created designated Provider Care Teams.  These Care Teams include your primary Cardiologist (physician) and Advanced Practice Providers (APPs -  Physician Assistants and Nurse Practitioners) who all work together to provide you with the care you need, when you need it. You will need a follow up appointment in 2 months (mid- February).  You may see DR Cristal DeerHRISTOPHER END or one of the following Advanced Practice Providers on your designated Care Team:   Nicolasa Duckinghristopher Berge, NP Eula Listenyan Dunn, PA-C . Marisue IvanJacquelyn Visser, PA-C

## 2018-11-12 NOTE — Progress Notes (Signed)
Follow-up Outpatient Visit Date: 11/12/2018  Primary Care Provider: Dion Body, MD [facility] [place] [address]  Chief Complaint: Shortness of breath and chest pain  HPI:  Jeffery Jenkins is a 22 y.o. 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  I met him in late October, at which time he had multiple complaints that began in early October.  He was hospitalized with a febrile illness (uncertain etiology) and sepsis.  He was seen by multiple providers in [place] and [place] due to recurrent atypical chest pain and shortness of breath.  Echocardiogram in our office on 10/03/18 was normal.  He was last seen by Christell Faith, PA, on 10/09/18.  He reported feeling even worse than when I met him, complaining of right sided chest pressure with associated significant fatigue, exertional dyspnea, numbness in the bilateral hands and perioral region, as well as worsening fatigue/malaise and bilateral leg cramping.  Cardiac CTA was recommended but has yet to be performed.  He was seen by neurology last month; brain MRI and LP were recommended.  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  Today, Jeffery Jenkins reports that he is gradually feeling better.  Neuro workup did not reveal a cause for his weakness and paresthesias, which have almost completely resolved.  He still notes intermittent shortness of breath with activity and associated chest pain, though this continues to improve as well.  He denies palpitations, lightheadedness, orthopnea, PND, and edema.  He did not notice any significant improvement with naproxen and has stopped taking  this.  --------------------------------------------------------------------------------------------------  Cardiovascular History & Procedures: Cardiovascular Problems:  Chest pain  Risk Factors:  None  Cath/PCI:  None  CV Surgery:  None  EP Procedures and Devices:  None  Non-Invasive Evaluation(s):  TTE (10/03/18): Normal LV size and wall thickness.  LVEF 55-60% with normal wall motion.  Normal RV size and function.  No significant abnormality.  Recent CV Pertinent Labs: Lab Results  Component Value Date   K 4.1 10/09/2018   MG 2.0 10/09/2018   BUN 16 10/09/2018   CREATININE 1.00 10/09/2018    Past medical and surgical history were reviewed and updated in EPIC.  Medications: None  Allergies: Other  Social History   Tobacco Use  . Smoking status: Never Smoker  . Smokeless tobacco: Never Used  Substance Use Topics  . Alcohol use: Not Currently  . Drug use: Never    Family History  Problem Relation Age of Onset  . Heart disease Maternal Grandfather        a. 34s  . Heart attack Paternal Grandfather 24       a. 45s  . Sudden Cardiac Death Neg Hx     Review of Systems: A 12-system review of systems was performed and was negative except as noted in the HPI.  --------------------------------------------------------------------------------------------------  Physical Exam: BP 122/62 (BP Location: Left Arm, Patient Position: Sitting, Cuff Size: Normal)   Pulse 63   Ht '5\' 10"'  (1.778 m)   Wt 152 lb (68.9 kg)   BMI 21.81 kg/m   General:  NAD HEENT: No conjunctival pallor or scleral icterus. Moist mucous membranes.  OP clear. Neck: Supple without lymphadenopathy, thyromegaly, JVD, or HJR. Lungs: Normal work of breathing. Clear to auscultation bilaterally without wheezes or crackles.  Heart: Regular rate and rhythm without murmurs, rubs, or gallops. Non-displaced PMI. Abd: Bowel sounds present. Soft, NT/ND without hepatosplenomegaly Ext: No  lower extremity edema. Radial, PT, and DP pulses are 2+ bilaterally. Skin: Warm and dry without rash.  EKG:  NSR with rightward axis.  No significant abnormality.  Lab Results  Component Value Date   WBC 8.8 09/29/2018   HGB 15.1 09/29/2018   HCT 43.9 09/29/2018   MCV 86.6 09/29/2018   PLT 342 09/29/2018    Lab Results  Component Value Date   NA 141 10/09/2018   K 4.1 10/09/2018   CL 106 10/09/2018   CO2 28 10/09/2018   BUN 16 10/09/2018   CREATININE 1.00 10/09/2018   GLUCOSE 102 (H) 10/27/2018   ALT 23 10/09/2018    No results found for: CHOL, HDL, LDLCALC, LDLDIRECT, TRIG, CHOLHDL  --------------------------------------------------------------------------------------------------  ASSESSMENT AND PLAN: Shortness of breath and chest pain Symptoms gradually improving without intervention.  Overall suspicion for CAD is low.  Echo in October was normal.  Jeffery Jenkins did not notice any improvement with naproxen.  We discussed utility of proceeding with previously ordered cardiac CTA and have agreed to defer this.  No further testing or intervention is planned at this time.  Follow-up: Return to clinic in 2-3 months to reassess symptoms.Jeffery Gave Bryon Parker, MD 11/13/2018 9:12 AM

## 2018-11-13 ENCOUNTER — Encounter: Payer: Self-pay | Admitting: Internal Medicine

## 2018-11-17 ENCOUNTER — Ambulatory Visit: Payer: 59 | Admitting: Internal Medicine

## 2018-11-17 ENCOUNTER — Encounter

## 2018-11-19 IMAGING — MR MR HEAD WO/W CM
13 series · 47 of 48 positions shown · IV contrast (gadavist)
Comparison: None.

CLINICAL DATA: Bilateral leg weakness, numbness, and tingling for
3-4 weeks. History of migraines.

EXAM:
MRI HEAD WITHOUT AND WITH CONTRAST
TECHNIQUE: Multiplanar, multiecho pulse sequences of the brain and surrounding
structures were obtained without and with intravenous contrast.
CONTRAST:  6.5 mL Gadavist

[Series 5: ax dwi_tracew · axial · 3.0mm · 0.60mm/px · z∈[-44,+118]mm · 4 of 55 slices shown]
[im 1/55]
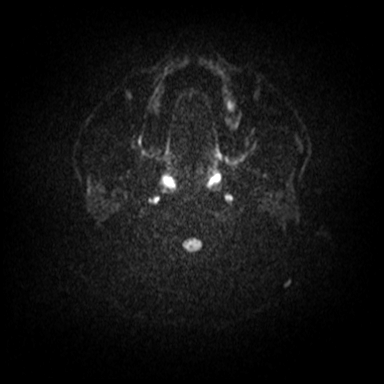
[im 19/55]
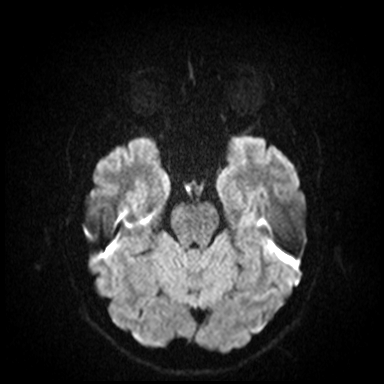
[im 37/55]
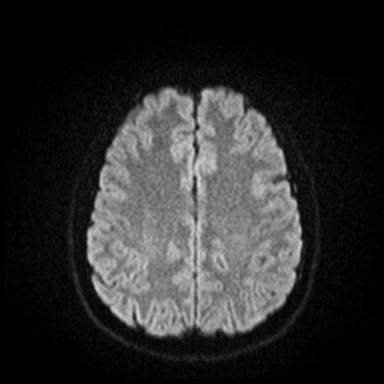
[im 55/55]
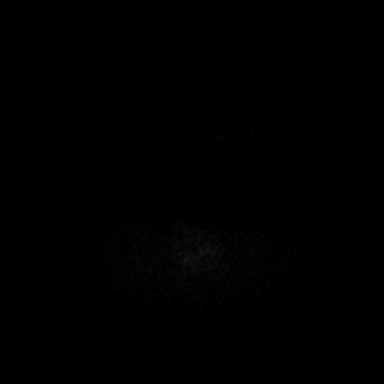

[Series 6: ax dwi_adc · axial · 3.0mm · 0.60mm/px · z∈[-44,+118]mm · 3 of 55 slices shown]
[im 1/55]
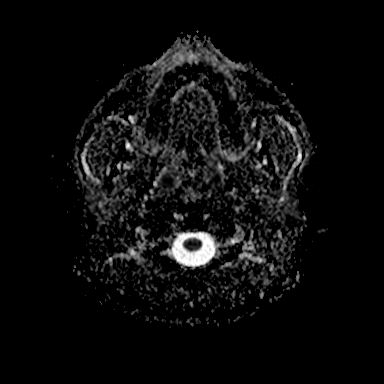
[im 28/55]
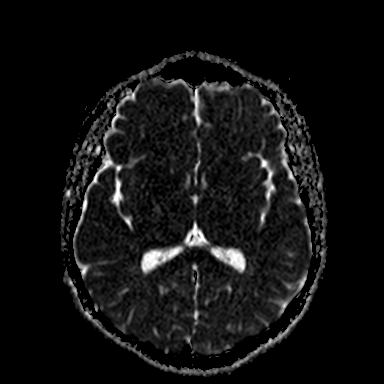
[im 55/55]
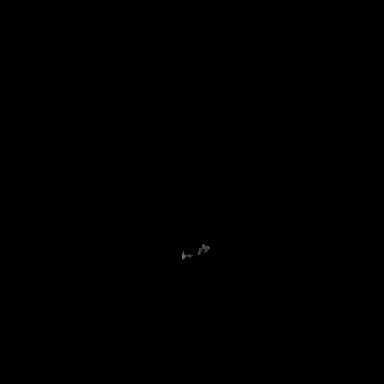

[Series 7: cor dwi_tracew · coronal · 5.0mm · 0.60mm/px · 2 of 38 slices shown]
[im 1/38]
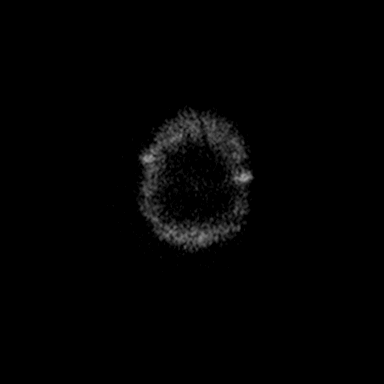
[im 38/38]
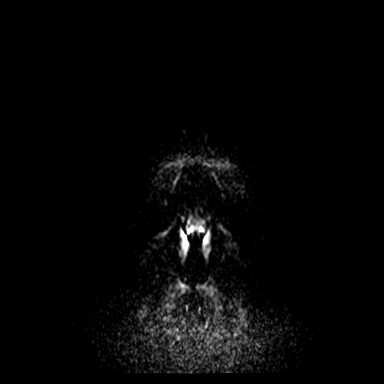

[Series 8: cor dwi_adc · coronal · 5.0mm · 0.60mm/px · 2 of 38 slices shown]
[im 1/38]
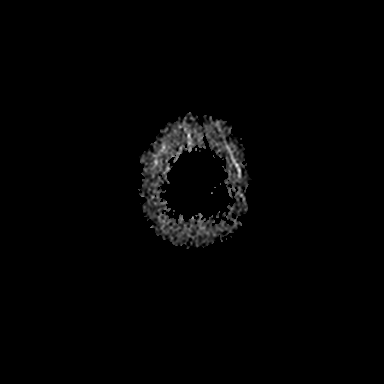
[im 38/38]
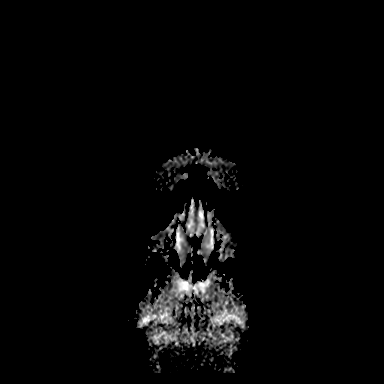

[Series 9: T1 · sagittal · 5.0mm · 0.62mm/px · 1 of 25 slices shown (1 of 2)]
[im 1/25]
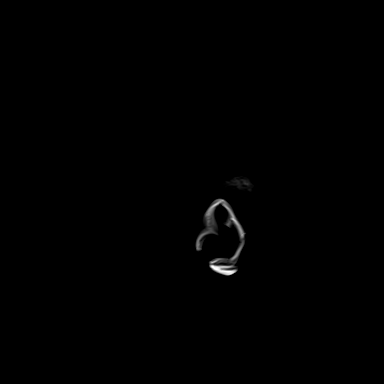

[Series 10: T2 · axial · 5.0mm · 0.55mm/px · z∈[-41,+115]mm · 2 of 27 slices shown]
[im 1/27]
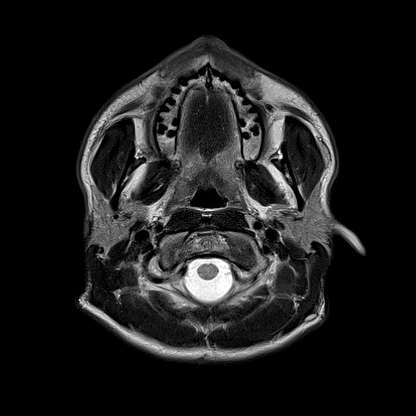
[im 27/27]
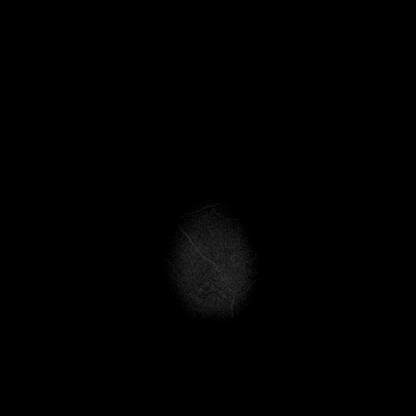

[Series 11: swi_images · axial · 3.0mm · 0.90mm/px · z∈[-51,+125]mm · 4 of 60 slices shown]
[im 1/60]
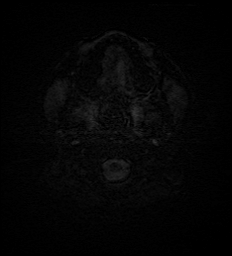
[im 20/60]
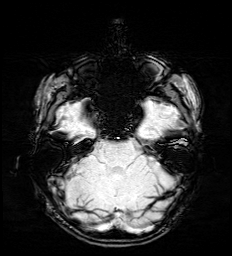
[im 40/60]
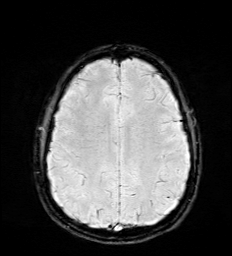
[im 60/60]
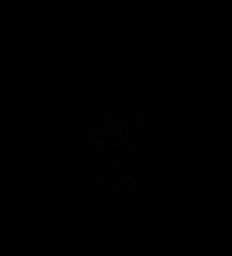

[Series 12: mip_images(sw) · axial · 24.0mm · 0.90mm/px · z∈[-41,+115]mm · 3 of 53 slices shown]
[im 1/53]
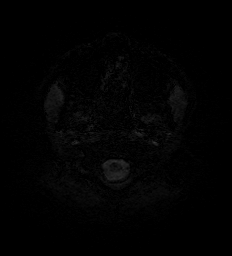
[im 27/53]
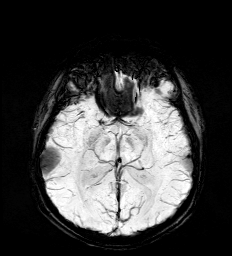
[im 53/53]
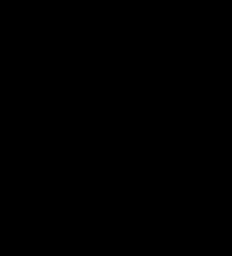

[Series 13: FLAIR · axial · 3.0mm · 0.55mm/px · z∈[-44,+118]mm · 3 of 55 slices shown]
[im 1/55]
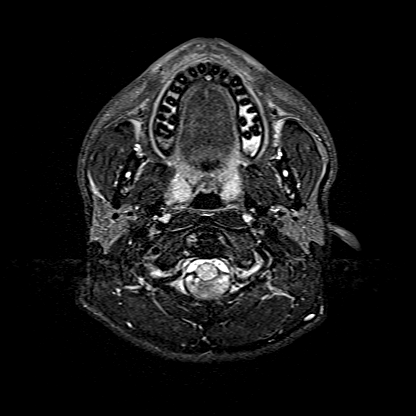
[im 28/55]
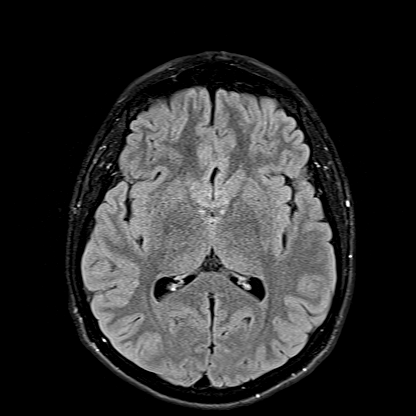
[im 55/55]
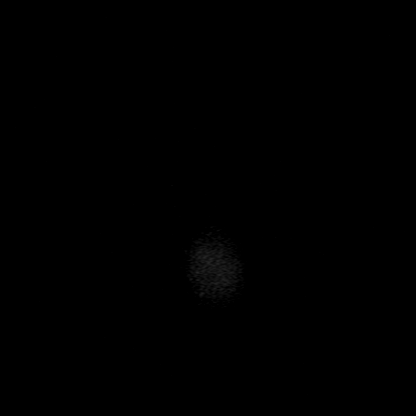

[Series 14: T1 · axial · 1.0mm · 0.98mm/px · z∈[-50,+125]mm · 9 of 174 slices shown (2 of 2)]
[im 1/174]
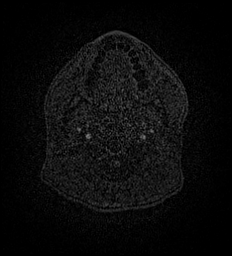
[im 20/174]
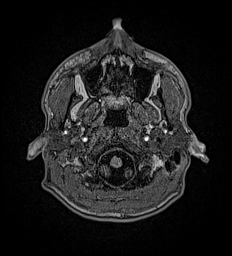
[im 39/174]
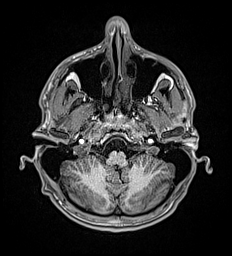
[im 58/174]
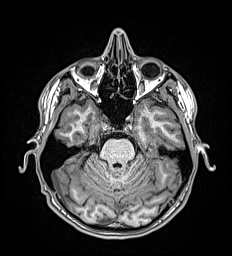
[im 77/174]
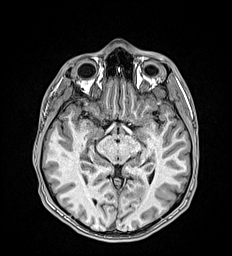
[im 97/174]
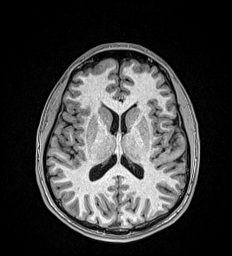
[im 116/174]
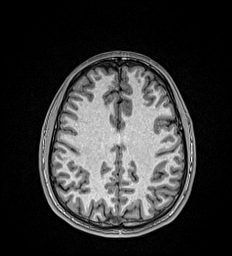
[im 154/174]
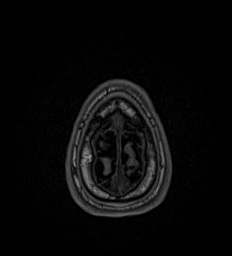
[im 174/174]
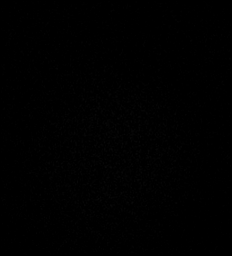

[Series 15: T2 post-contrast · coronal · 5.0mm · 0.57mm/px · 2 of 29 slices shown]
[im 1/29]
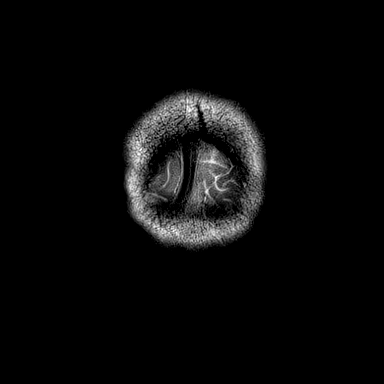
[im 29/29]
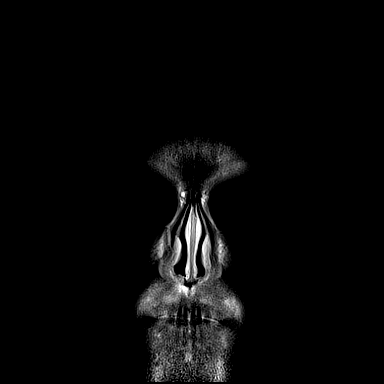

[Series 16: T1 post-contrast · axial · 1.0mm · 0.98mm/px · z∈[-50,+123]mm · 10 of 174 slices shown (1 of 2)]
[im 1/174]
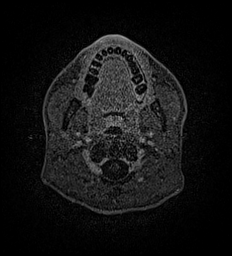
[im 20/174]
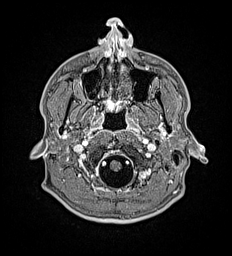
[im 39/174]
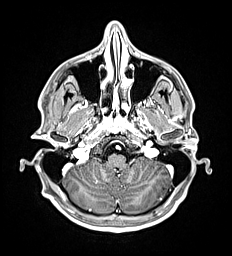
[im 58/174]
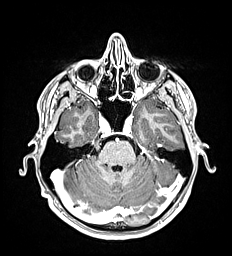
[im 77/174]
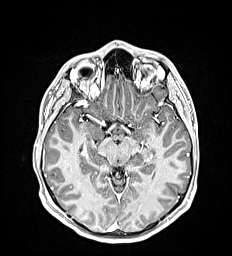
[im 97/174]
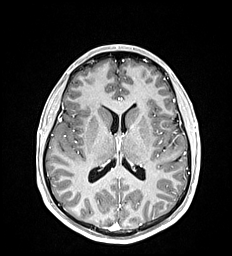
[im 116/174]
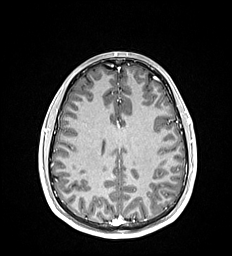
[im 135/174]
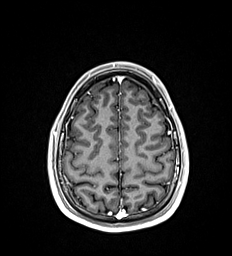
[im 154/174]
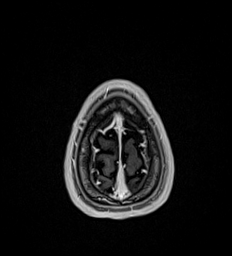
[im 174/174]
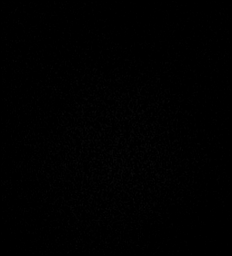

[Series 17: T1 post-contrast · coronal · 5.0mm · 0.57mm/px · 2 of 29 slices shown (2 of 2)]
[im 1/29]
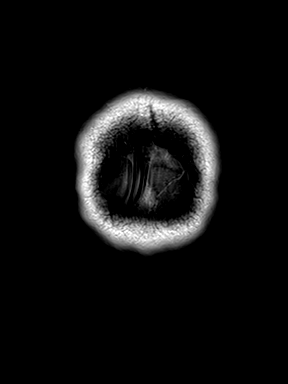
[im 29/29]
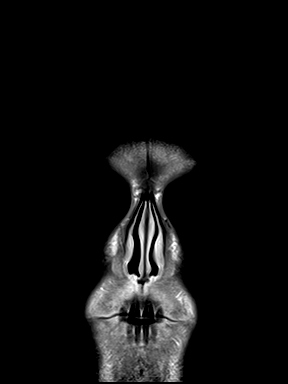

[47 of 48 positions shown; findings below may reference images not displayed]

FINDINGS: Brain: There is no evidence of acute infarct, intracranial
hemorrhage, mass, midline shift, or extra-axial fluid collection.
The ventricles and sulci are normal. A punctate focus of enhancement
in the right cerebellum has associated branching susceptibility
artifact and is favored to represent a developmental venous anomaly
or other incidental vascular malformation. A small developmental
venous anomaly is also noted in the parasagittal right parietal
lobe. The brain is otherwise normal in signal.

Vascular: Major intracranial vascular flow voids are preserved.

Skull and upper cervical spine: Unremarkable bone marrow signal.

Sinuses/Orbits: Unremarkable orbits. Trace left maxillary sinus
mucosal thickening. Clear mastoid air cells.

Other: None.
IMPRESSION: 1. No acute or significant intracranial findings.
2. Small incidental developmental venous anomalies in the right
cerebellum and right parietal lobe.

## 2018-11-21 IMAGING — RF DG FLUORO GUIDE LUMBAR PUNCTURE
1 series · 1 of 1 positions shown · non-contrast
Comparison: none

CLINICAL DATA: Bilateral upper and lower extremity numbness,
weakness, and tingling for the past 3 weeks.

[Series 1: cp_standard · 0.18mm/px · 1 of 1 slices shown]
[im 1/1]
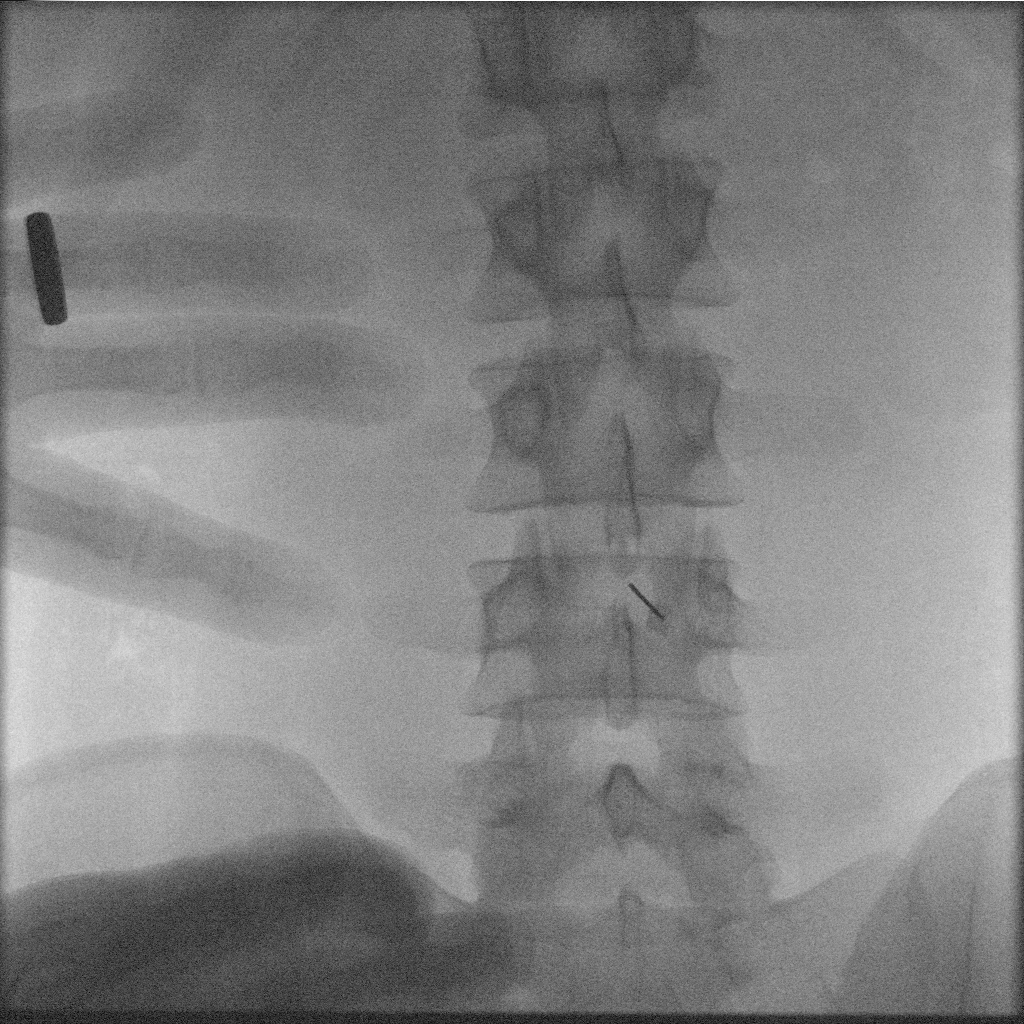

[1 of 1 positions shown; findings below may reference images not displayed]

EXAM:
DIAGNOSTIC LUMBAR PUNCTURE UNDER FLUOROSCOPIC GUIDANCE

FLUOROSCOPY TIME:  Fluoroscopy Time:  18 seconds

Radiation Exposure Index (if provided by the fluoroscopic device):
1.6 mGy

Number of Acquired Spot Images: 1 screen save

PROCEDURE:
The anticipated procedure was discussed with the patient and with
his mother. Informed consent was obtained from the patient prior to
the procedure, including potential complications of headache,
allergy, bleeding, infection, and pain including spinal headache. A
time-out procedure was called. The intended puncture site was marked
with an indelible marker. With the patient prone, the lower back was
prepped with Betadine. 1% Lidocaine was used for local anesthesia.
Lumbar puncture was performed at the [DATE] level using a 20 gauge
needle with return of clear CSF with an opening pressure of 14 cm
water. Six ml of CSF were obtained for laboratory studies. Closing
pressure was 11 cm water. The patient tolerated the procedure well
and there were no apparent complications.
IMPRESSION: Successful lumbar puncture with acquisition of 6 cc of clear
colorless spinal fluid for the requested laboratory values. The
opening pressure was 14 cm water and the closing pressure 11 cm
water. The patient tolerated the procedure well and was taken to the
same day surgical recovery area in good condition.

## 2018-11-26 ENCOUNTER — Encounter: Payer: Self-pay | Admitting: Physician Assistant

## 2019-01-21 ENCOUNTER — Ambulatory Visit: Payer: 59 | Admitting: Internal Medicine

## 2019-01-23 ENCOUNTER — Ambulatory Visit (INDEPENDENT_AMBULATORY_CARE_PROVIDER_SITE_OTHER): Payer: 59 | Admitting: Internal Medicine

## 2019-01-23 ENCOUNTER — Encounter: Payer: Self-pay | Admitting: Internal Medicine

## 2019-01-23 VITALS — BP 112/76 | HR 50 | Ht 70.0 in | Wt 151.0 lb

## 2019-01-23 DIAGNOSIS — R0789 Other chest pain: Secondary | ICD-10-CM | POA: Diagnosis not present

## 2019-01-23 NOTE — Progress Notes (Signed)
Follow-up Outpatient Visit Date: 01/23/2019  Primary Care Provider: Marisue Ivan, MD [phone] [facility] [address] [facility] [place] [address]  Chief Complaint: Chest pain  HPI:  Jeffery Jenkins is a 23 y.o. 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  I last saw him in December, at which time he reported gradually feeling better.  He still had intermittent shortness of breath and chest discomfort.  He noted that symptoms did not seem to respond to naproxen.  Today, Jeffery Jenkins reports that he is almost back to normal.  Leg weakness/paresthesia and shortness of breath have almost completely resolved.  He continues to have occasional substernal chest tightness.  It can happen randomly, though it most often occurs when he is exerting himself.  He notices the pain every 3-4 days. He denies palpitations, lightheadedness, and edema.  Previously discussed cardiac CTA has not been performed; Jeffery Jenkins reports insurance issues related to this.  --------------------------------------------------------------------------------------------------  Cardiovascular History & Procedures: Cardiovascular Problems:  Chest pain  Risk Factors:  None  Cath/PCI:  None  CV Surgery:  None  EP Procedures and Devices:  None  Non-Invasive Evaluation(s):  TTE (10/03/18): Normal LV size and wall thickness.  LVEF 55-60% with normal wall motion.  Normal RV size and function.  No significant abnormality.  Recent CV Pertinent Labs: Lab Results  Component Value Date   K 4.1 10/09/2018   MG 2.0 10/09/2018   BUN 16 10/09/2018   CREATININE 1.00 10/09/2018    Past medical and surgical history were reviewed and updated in EPIC.  Medications:  None  Allergies: Other  Social History   Tobacco Use  . Smoking status: Never Smoker  . Smokeless tobacco: Never Used  Substance Use Topics  . Alcohol use: Not Currently  . Drug use: Never    Family History  Problem Relation Age of Onset  . Heart disease Maternal Grandfather        a. 40s  . Heart attack Paternal Grandfather 40       a. 40s  . Sudden Cardiac Death Neg Hx     Review of Systems: A 12-system review of systems was performed and was negative except as noted in the HPI.  --------------------------------------------------------------------------------------------------  Physical Exam: BP 112/76 (BP Location: Left Arm, Patient Position: Sitting, Cuff Size: Normal)   Pulse (!) 50   Ht 5\' 10"  (1.778 m)   Wt 151 lb (68.5 kg)   BMI 21.67 kg/m   General:  NAD. HEENT: No conjunctival pallor or scleral icterus. Moist mucous membranes.  OP clear. Neck: Supple without lymphadenopathy, thyromegaly, JVD, or HJR. Lungs: Normal work of breathing. Clear to auscultation bilaterally without wheezes or crackles. Heart: Bradycardic but regular without murmurs, rubs, or gallops. Non-displaced PMI. Abd: Bowel sounds present. Soft, NT/ND without hepatosplenomegaly Ext: No lower extremity edema. Radial, PT, and DP pulses are 2+ bilaterally. Skin: Warm and dry without rash.  EKG:  Sinus bradycardia without abnormality.  Lab Results  Component Value Date   WBC 8.8 09/29/2018   HGB 15.1 09/29/2018   HCT 43.9 09/29/2018   MCV 86.6 09/29/2018   PLT 342 09/29/2018    Lab Results  Component Value Date   NA 141 10/09/2018   K 4.1 10/09/2018   CL 106 10/09/2018   CO2 28 10/09/2018   BUN  16 10/09/2018   CREATININE 1.00 10/09/2018   GLUCOSE 102 (H) 10/27/2018   ALT 23 10/09/2018    No results found for: CHOL, HDL, LDLCALC, LDLDIRECT, TRIG, CHOLHDL  --------------------------------------------------------------------------------------------------  ASSESSMENT AND  PLAN: Chest pain Given young age and minimal risk factors, my suspicion for coronary artery disease remains low.  However, given that other symptoms that began after likely viral illness in 09/2018 have almost completely resolved, we have discussed further evaluation options.  We will obtain an exercise tolerance test.  If this is low risk, I think further cardiac testing can be deferred.  Of note, he did not experience much improvement with naproxen and pantoprazole.  Follow-up: Return to clinic as needed based on results of ETT and symptoms.  Yvonne Kendall, MD 01/24/2019 2:34 PM

## 2019-01-23 NOTE — Patient Instructions (Addendum)
Medication Instructions:  Your physician recommends that you continue on your current medications as directed. Please refer to the Current Medication list given to you today.  If you need a refill on your cardiac medications before your next appointment, please call your pharmacy.   Lab work: none If you have labs (blood work) drawn today and your tests are completely normal, you will receive your results only by: Marland Kitchen MyChart Message (if you have MyChart) OR . A paper copy in the mail If you have any lab test that is abnormal or we need to change your treatment, we will call you to review the results.  Testing/Procedures: Your physician has requested that you have an exercise tolerance test. For further information please visit [email or web address] Please also follow instruction sheet, as given.   DO NOT drink or eat foods with caffeine for 24 hours before the test. (Chocolate, coffee, tea, decaf coffee/tea, or energy drinks)  DO NOT smoke for 4 hours before your test.  If you use an inhaler, bring it with you to the test.  Wear comfortable shoes and clothing.    Follow-Up: At [facility], you and your health needs are our priority.  As part of our continuing mission to provide you with exceptional heart care, we have created designated Provider Care Teams.  These Care Teams include your primary Cardiologist (physician) and Advanced Practice Providers (APPs -  Physician Assistants and Nurse Practitioners) who all work together to provide you with the care you need, when you need it. You will need a follow up appointment AS NEEDED BASIS PENDING RESULTS.     Exercise Stress Test An exercise stress test is a test to check how your heart works during exercise. You will need to walk on a treadmill or ride an exercise bike for this test. An electrocardiogram (ECG) will record your heartbeat when you are at rest and when you are exercising. You may have an ultrasound or nuclear test after  the exercise test. The test is done to check for coronary artery disease (CAD). It is also done to:  See how well you can exercise.  Watch for high blood pressure during exercise.  Test how well you can exercise after treatment.  Check the blood flow to your arms and legs. If your test result is not normal, more testing may be needed. What happens before the procedure?  Follow instructions from your doctor about what you cannot eat or drink. ? Do not have any drinks or foods that have caffeine in them for 24 hours before the test, or as told by your doctor. This includes coffee, tea (even decaf tea), sodas, chocolate, and cocoa.  Ask your doctor about changing or stopping your normal medicines. This is important if you: ? Take diabetes medicines. ? Take beta-blocker medicines. ? Wear a nitroglycerin patch.  If you use an inhaler, bring it with you to the test.  Do not put lotions, powders, creams, or oils on your chest before the test.  Wear comfortable shoes and clothing.  Do not use any products that have nicotine or tobacco in them, such as cigarettes and e-cigarettes. Stop using them at least 4 hours before the test. If you need help quitting, ask your doctor. What happens during the procedure?  Patches (electrodes) will be put on your chest.  Wires will be connected to the patches. The wires will send signals to a machine to record your heartbeat.  Your heart rate will be watched while you are  resting and while you are exercising. Your blood pressure will also be watched during the test.  You will walk on a treadmill or use a stationary bike. If you cannot use these, you may be asked to turn a crank with your hands.  The activity will get harder and will raise your heart rate.  You may be asked to breathe into a tube a few times during the test. This measures the gases that you breathe out.  You will be asked how you are feeling throughout the test.  You will exercise  until your heart reaches a target heart rate. You will stop early if: ? You feel dizzy. ? You have chest pain. ? You are out of breath. ? Your blood pressure is too high or too low. ? You have an irregular heartbeat. ? You have pain or aching in your arms or legs. The procedure may vary among doctors and hospitals. What happens after the procedure?  Your blood pressure, heart rate, breathing rate, and blood oxygen level will be watched after the test.  You may return to your normal diet and activities as told by your doctor.  It is up to you to get the results of your test. Ask your doctor, or the department that is doing the test, when your results will be ready. Summary  An exercise stress test is a test to check how your heart works during exercise.  This test is done to check for coronary artery disease.  Your heart rate will be watched while you are resting and while you are exercising.  Follow instructions from your doctor about what you cannot eat or drink before the test. This information is not intended to replace advice given to you by your health care provider. Make sure you discuss any questions you have with your health care provider. Document Released: 05/14/2008 Document Revised: 02/26/2017 Document Reviewed: 02/26/2017 Elsevier Interactive Patient Education  2019 [employer].

## 2019-01-24 ENCOUNTER — Encounter: Payer: Self-pay | Admitting: Internal Medicine

## 2019-02-02 ENCOUNTER — Telehealth: Payer: Self-pay | Admitting: *Deleted

## 2019-02-02 NOTE — Telephone Encounter (Signed)
No answer. Left message, ok per DPR, as a friendly reminder for date and time of treadmill tomorrow as well as the following:  DO NOT drink or eat foods with caffeine for 24 hours before the test. (Chocolate, coffee, tea, decaf coffee/tea, or energy drinks)  DO NOT smoke for 4 hours before your test.  If you use an inhaler, bring it with you to the test.  Wear comfortable shoes and clothing.

## 2019-02-03 ENCOUNTER — Ambulatory Visit (INDEPENDENT_AMBULATORY_CARE_PROVIDER_SITE_OTHER): Payer: 59

## 2019-02-03 DIAGNOSIS — R0789 Other chest pain: Secondary | ICD-10-CM | POA: Diagnosis not present

## 2019-02-03 LAB — EXERCISE TOLERANCE TEST
CHL CUP MPHR: 200 {beats}/min
CHL CUP RESTING HR STRESS: 80 {beats}/min
CSEPEDS: 13 s
CSEPPHR: 179 {beats}/min
Estimated workload: 15.5 METS
Exercise duration (min): 13 min
Percent HR: 89 %
RPE: 15

## 2019-02-05 ENCOUNTER — Telehealth: Payer: Self-pay | Admitting: *Deleted

## 2019-02-05 DIAGNOSIS — R0789 Other chest pain: Secondary | ICD-10-CM

## 2019-02-05 DIAGNOSIS — R079 Chest pain, unspecified: Secondary | ICD-10-CM

## 2019-02-05 DIAGNOSIS — Z01810 Encounter for preprocedural cardiovascular examination: Secondary | ICD-10-CM

## 2019-02-05 DIAGNOSIS — R9439 Abnormal result of other cardiovascular function study: Secondary | ICD-10-CM

## 2019-02-05 NOTE — Telephone Encounter (Signed)
Results called to pt. Pt verbalized understanding. He is agreeable to the Cardiac CTA and remembers Dr End talking about it when he was here in the office.  Patient verbalized understanding of the following instructions. He is [facility] once the [facility] is scheduled.  Message sent to precert. Instructions also sent to patient's MyChart.  He was appreciative.  He also asked if it was ok to do physical exercise between now and the CTA. Advised I would verify that with Dr End.     Please arrive at the [facility] main entrance of [facility] at ________ AM (30-45 minutes prior to test start time)  [facility] [address] [place], [place] [address] [phone]  Proceed to the [facility] Radiology Department (First Floor).  Please follow these instructions carefully (unless otherwise directed):  On the Night Before the Test: . Be sure to Drink plenty of water. . Do not consume any caffeinated/decaffeinated beverages or chocolate 12 hours prior to your test. . Do not take any antihistamines 12 hours prior to your test.  On the Day of the Test: . Drink plenty of water. Do not drink any water within one hour of the test. . Do not eat any food 4 hours prior to the test. . You may take your regular medications prior to the test.        After the Test: . Drink plenty of water. . After receiving IV contrast, you may experience a mild flushed feeling. This is normal. . On occasion, you may experience a mild rash up to 24 hours after the test. This is not dangerous. If this occurs, you can take Benadryl 25 mg and increase your fluid intake. . If you experience trouble breathing, this can be serious. If it is severe call 911 IMMEDIATELY. If it is mild, please call our office.

## 2019-02-05 NOTE — Telephone Encounter (Signed)
-----   Message from Yvonne Kendall, MD sent at 02/04/2019  9:22 AM EST ----- Please let Mr. Hartung know that his stress test shows some abnormalities.  Given his young age and lack of risk factors, I still think it is unlikely that he has significant coronary artery disease.  However, given his continued chest pain (worsened with exertion during ETT) and inferior ST depressions at peak stress, I recommend that we proceed with cardiac CTA.

## 2019-02-08 NOTE — Telephone Encounter (Signed)
I think it is okay to do mild activity.  However, he should avoid doing ay vigorous exercise or other activity that elicits his chest pain until cardiac CTA has been completed.  Yvonne Kendall, MD [facility] Pager: [phone]

## 2019-02-09 NOTE — Telephone Encounter (Signed)
Patient notified and verbalized understanding of his recommendations.

## 2019-05-22 ENCOUNTER — Encounter: Payer: Self-pay | Admitting: Internal Medicine
